# Patient Record
Sex: Female | Born: 1960 | Race: White | Hispanic: No | Marital: Married | Smoking: Current every day smoker
Health system: Southern US, Community
[De-identification: ages and names within clinical notes are randomized; demographics above are authoritative.]

## PROBLEM LIST (undated history)

## (undated) DIAGNOSIS — R112 Nausea with vomiting, unspecified: Secondary | ICD-10-CM

## (undated) DIAGNOSIS — I739 Peripheral vascular disease, unspecified: Secondary | ICD-10-CM

## (undated) DIAGNOSIS — K589 Irritable bowel syndrome without diarrhea: Secondary | ICD-10-CM

## (undated) DIAGNOSIS — K219 Gastro-esophageal reflux disease without esophagitis: Secondary | ICD-10-CM

## (undated) DIAGNOSIS — Z87442 Personal history of urinary calculi: Secondary | ICD-10-CM

## (undated) DIAGNOSIS — I10 Essential (primary) hypertension: Secondary | ICD-10-CM

## (undated) DIAGNOSIS — M1990 Unspecified osteoarthritis, unspecified site: Secondary | ICD-10-CM

## (undated) DIAGNOSIS — R519 Headache, unspecified: Secondary | ICD-10-CM

## (undated) DIAGNOSIS — D649 Anemia, unspecified: Secondary | ICD-10-CM

## (undated) DIAGNOSIS — Z9889 Other specified postprocedural states: Secondary | ICD-10-CM

## (undated) DIAGNOSIS — E119 Type 2 diabetes mellitus without complications: Secondary | ICD-10-CM

## (undated) HISTORY — PX: ANTERIOR FUSION CERVICAL SPINE: SUR626

## (undated) HISTORY — PX: APPENDECTOMY: SHX54

## (undated) HISTORY — PX: POSTERIOR FUSION CERVICAL SPINE: SUR628

---
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
## 1981-02-24 HISTORY — PX: DILATION AND CURETTAGE, DIAGNOSTIC / THERAPEUTIC: SUR384

## 1983-02-25 HISTORY — PX: ABDOMINAL HYSTERECTOMY: SHX81

## 1991-02-25 HISTORY — PX: OTHER SURGICAL HISTORY: SHX169

## 1995-02-25 HISTORY — PX: OTHER SURGICAL HISTORY: SHX169

## 1996-02-25 HISTORY — PX: CARPAL TUNNEL RELEASE: SHX101

## 1997-06-29 ENCOUNTER — Ambulatory Visit (HOSPITAL_BASED_OUTPATIENT_CLINIC_OR_DEPARTMENT_OTHER): Admission: RE | Admit: 1997-06-29 | Discharge: 1997-06-29 | Payer: Self-pay | Admitting: Orthopedic Surgery

## 1999-01-01 ENCOUNTER — Encounter: Payer: Self-pay | Admitting: Obstetrics and Gynecology

## 1999-01-01 ENCOUNTER — Encounter: Payer: Self-pay | Admitting: Emergency Medicine

## 1999-01-23 ENCOUNTER — Encounter: Payer: Self-pay | Admitting: Obstetrics and Gynecology

## 1999-08-14 ENCOUNTER — Encounter: Payer: Self-pay | Admitting: Obstetrics and Gynecology

## 2000-03-23 ENCOUNTER — Encounter: Payer: Self-pay | Admitting: Emergency Medicine

## 2000-07-13 ENCOUNTER — Encounter: Payer: Self-pay | Admitting: Obstetrics and Gynecology

## 2000-07-22 ENCOUNTER — Encounter: Payer: Self-pay | Admitting: Emergency Medicine

## 2001-01-11 ENCOUNTER — Encounter: Payer: Self-pay | Admitting: Internal Medicine

## 2004-01-12 ENCOUNTER — Ambulatory Visit: Payer: Self-pay | Admitting: Family Medicine

## 2004-02-25 HISTORY — PX: EYE SURGERY: SHX253

## 2004-06-04 ENCOUNTER — Ambulatory Visit: Payer: Self-pay | Admitting: Family Medicine

## 2004-08-20 ENCOUNTER — Ambulatory Visit: Payer: Self-pay | Admitting: Family Medicine

## 2004-08-21 ENCOUNTER — Ambulatory Visit: Payer: Self-pay | Admitting: Family Medicine

## 2004-12-23 ENCOUNTER — Ambulatory Visit: Payer: Self-pay | Admitting: Family Medicine

## 2006-02-24 HISTORY — PX: CHOLECYSTECTOMY: SHX55

## 2006-05-29 ENCOUNTER — Encounter: Admission: RE | Admit: 2006-05-29 | Discharge: 2006-05-29 | Payer: Self-pay | Admitting: Orthopedic Surgery

## 2006-05-30 ENCOUNTER — Emergency Department (HOSPITAL_COMMUNITY): Admission: EM | Admit: 2006-05-30 | Discharge: 2006-05-30 | Payer: Self-pay | Admitting: Emergency Medicine

## 2008-02-25 HISTORY — PX: RECTOCELE REPAIR: SHX761

## 2008-02-25 HISTORY — PX: CYSTOCELE REPAIR: SHX163

## 2009-09-13 ENCOUNTER — Ambulatory Visit: Payer: Self-pay | Admitting: Obstetrics and Gynecology

## 2010-02-08 ENCOUNTER — Encounter
Admission: RE | Admit: 2010-02-08 | Discharge: 2010-02-08 | Payer: Self-pay | Source: Home / Self Care | Attending: Orthopedic Surgery | Admitting: Orthopedic Surgery

## 2014-06-14 ENCOUNTER — Other Ambulatory Visit: Payer: Self-pay | Admitting: Orthopedic Surgery

## 2014-06-14 DIAGNOSIS — M542 Cervicalgia: Secondary | ICD-10-CM

## 2014-06-16 ENCOUNTER — Other Ambulatory Visit: Payer: Self-pay

## 2014-06-19 ENCOUNTER — Ambulatory Visit
Admission: RE | Admit: 2014-06-19 | Discharge: 2014-06-19 | Disposition: A | Discharge status: Home / Self Care | Payer: BC Managed Care – PPO | Source: Ambulatory Visit | Attending: Orthopedic Surgery | Admitting: Orthopedic Surgery

## 2014-06-19 DIAGNOSIS — M542 Cervicalgia: Secondary | ICD-10-CM

## 2014-06-19 MED ORDER — DIAZEPAM 5 MG PO TABS
5.0000 mg | ORAL_TABLET | Freq: Once | ORAL | Status: AC
  Administered 2014-06-19: 5 mg via ORAL

## 2014-06-19 MED ORDER — ONDANSETRON HCL 4 MG/2ML IJ SOLN
4.0000 mg | Freq: Once | INTRAMUSCULAR | Status: AC
  Administered 2014-06-19: 4 mg via INTRAMUSCULAR

## 2014-06-19 MED ORDER — ONDANSETRON HCL 4 MG/2ML IJ SOLN: 4.0000 mg | Freq: Four times a day (QID) | INTRAMUSCULAR | Status: DC | PRN

## 2014-06-19 MED ORDER — IOHEXOL 300 MG/ML  SOLN
10.0000 mL | Freq: Once | INTRAMUSCULAR | Status: AC | PRN
  Administered 2014-06-19: 10 mL via INTRATHECAL

## 2014-06-19 MED ORDER — MEPERIDINE HCL 100 MG/ML IJ SOLN
75.0000 mg | Freq: Once | INTRAMUSCULAR | Status: AC
  Administered 2014-06-19: 75 mg via INTRAMUSCULAR

## 2014-06-19 NOTE — Discharge Instructions (Signed)

## 2015-05-08 DIAGNOSIS — E782 Mixed hyperlipidemia: Secondary | ICD-10-CM | POA: Insufficient documentation

## 2015-05-08 DIAGNOSIS — E119 Type 2 diabetes mellitus without complications: Secondary | ICD-10-CM | POA: Insufficient documentation

## 2015-05-08 DIAGNOSIS — I10 Essential (primary) hypertension: Secondary | ICD-10-CM | POA: Insufficient documentation

## 2015-11-07 DIAGNOSIS — G43909 Migraine, unspecified, not intractable, without status migrainosus: Secondary | ICD-10-CM | POA: Insufficient documentation

## 2015-11-07 DIAGNOSIS — K589 Irritable bowel syndrome without diarrhea: Secondary | ICD-10-CM | POA: Insufficient documentation

## 2015-11-07 DIAGNOSIS — R32 Unspecified urinary incontinence: Secondary | ICD-10-CM | POA: Insufficient documentation

## 2015-11-07 DIAGNOSIS — G5603 Carpal tunnel syndrome, bilateral upper limbs: Secondary | ICD-10-CM | POA: Insufficient documentation

## 2016-08-21 ENCOUNTER — Other Ambulatory Visit: Payer: Self-pay | Admitting: Physical Medicine and Rehabilitation

## 2016-09-04 ENCOUNTER — Other Ambulatory Visit: Payer: Self-pay | Admitting: Physical Medicine and Rehabilitation

## 2016-09-05 ENCOUNTER — Other Ambulatory Visit: Payer: Self-pay | Admitting: Physical Medicine and Rehabilitation

## 2016-09-05 DIAGNOSIS — M5416 Radiculopathy, lumbar region: Secondary | ICD-10-CM

## 2016-09-09 ENCOUNTER — Ambulatory Visit
Admission: RE | Admit: 2016-09-09 | Discharge: 2016-09-09 | Disposition: A | Discharge status: Home / Self Care | Payer: BC Managed Care – PPO | Source: Ambulatory Visit | Attending: Physical Medicine and Rehabilitation | Admitting: Physical Medicine and Rehabilitation

## 2016-09-09 DIAGNOSIS — M5416 Radiculopathy, lumbar region: Secondary | ICD-10-CM

## 2016-10-13 DIAGNOSIS — F17200 Nicotine dependence, unspecified, uncomplicated: Secondary | ICD-10-CM | POA: Insufficient documentation

## 2016-10-13 DIAGNOSIS — Z716 Tobacco abuse counseling: Secondary | ICD-10-CM | POA: Insufficient documentation

## 2016-11-21 DIAGNOSIS — I739 Peripheral vascular disease, unspecified: Secondary | ICD-10-CM | POA: Insufficient documentation

## 2017-11-25 DIAGNOSIS — Z8249 Family history of ischemic heart disease and other diseases of the circulatory system: Secondary | ICD-10-CM | POA: Insufficient documentation

## 2018-12-02 DIAGNOSIS — R399 Unspecified symptoms and signs involving the genitourinary system: Secondary | ICD-10-CM | POA: Insufficient documentation

## 2019-01-17 ENCOUNTER — Other Ambulatory Visit: Payer: Self-pay | Admitting: Physical Medicine and Rehabilitation

## 2019-02-01 ENCOUNTER — Other Ambulatory Visit: Payer: Self-pay | Admitting: Physical Medicine and Rehabilitation

## 2019-02-01 DIAGNOSIS — M5416 Radiculopathy, lumbar region: Secondary | ICD-10-CM

## 2019-02-09 ENCOUNTER — Ambulatory Visit
Admission: RE | Admit: 2019-02-09 | Discharge: 2019-02-09 | Disposition: A | Discharge status: Home / Self Care | Payer: BC Managed Care – PPO | Source: Ambulatory Visit | Attending: Physical Medicine and Rehabilitation | Admitting: Physical Medicine and Rehabilitation

## 2019-02-09 DIAGNOSIS — M5416 Radiculopathy, lumbar region: Secondary | ICD-10-CM

## 2019-05-03 ENCOUNTER — Other Ambulatory Visit: Payer: Self-pay | Admitting: Neurosurgery

## 2019-05-03 DIAGNOSIS — M8580 Other specified disorders of bone density and structure, unspecified site: Secondary | ICD-10-CM

## 2019-05-06 ENCOUNTER — Other Ambulatory Visit: Payer: Self-pay | Admitting: Neurosurgery

## 2019-05-09 ENCOUNTER — Other Ambulatory Visit: Payer: Self-pay | Admitting: Neurosurgery

## 2019-05-09 ENCOUNTER — Telehealth: Payer: Self-pay | Admitting: Nurse Practitioner

## 2019-05-09 DIAGNOSIS — M4316 Spondylolisthesis, lumbar region: Secondary | ICD-10-CM

## 2019-05-09 NOTE — Telephone Encounter (Signed)
Phone call to patient to verify medication list and allergies for myelogram procedure. Pt instructed to hold Nucynta for 48hrs prior to myelogram appointment time. Pt verbalized understanding. Pre and post procedure instructions reviewed with pt.

## 2019-05-16 ENCOUNTER — Ambulatory Visit
Admission: RE | Admit: 2019-05-16 | Discharge: 2019-05-16 | Disposition: A | Discharge status: Home / Self Care | Payer: BC Managed Care – PPO | Source: Ambulatory Visit | Attending: Neurosurgery | Admitting: Neurosurgery

## 2019-05-16 DIAGNOSIS — M4316 Spondylolisthesis, lumbar region: Secondary | ICD-10-CM

## 2019-05-16 MED ORDER — DIAZEPAM 5 MG PO TABS
10.0000 mg | ORAL_TABLET | Freq: Once | ORAL | Status: AC
  Administered 2019-05-16: 10:00:00 5 mg via ORAL

## 2019-05-16 MED ORDER — IOPAMIDOL (ISOVUE-M 200) INJECTION 41%
15.0000 mL | Freq: Once | INTRAMUSCULAR | Status: AC
  Administered 2019-05-16: 11:00:00 15 mL via INTRATHECAL

## 2019-05-16 NOTE — Progress Notes (Signed)
Patient states she has been off Nucynta for at least the past two days.

## 2019-05-16 NOTE — Discharge Instructions (Signed)

## 2019-05-30 ENCOUNTER — Ambulatory Visit
Admission: RE | Admit: 2019-05-30 | Discharge: 2019-05-30 | Disposition: A | Discharge status: Home / Self Care | Payer: BC Managed Care – PPO | Source: Ambulatory Visit | Attending: Neurosurgery | Admitting: Neurosurgery

## 2019-05-30 ENCOUNTER — Other Ambulatory Visit: Payer: Self-pay

## 2019-05-30 DIAGNOSIS — M8580 Other specified disorders of bone density and structure, unspecified site: Secondary | ICD-10-CM

## 2019-10-10 ENCOUNTER — Other Ambulatory Visit: Payer: Self-pay | Admitting: Neurosurgery

## 2019-10-20 ENCOUNTER — Encounter (HOSPITAL_COMMUNITY)
Admission: RE | Admit: 2019-10-20 | Discharge: 2019-10-20 | Disposition: A | Discharge status: Home / Self Care | Payer: BC Managed Care – PPO | Source: Ambulatory Visit | Attending: Neurosurgery | Admitting: Neurosurgery

## 2019-10-20 ENCOUNTER — Encounter (HOSPITAL_COMMUNITY): Payer: Self-pay

## 2019-10-20 ENCOUNTER — Other Ambulatory Visit: Payer: Self-pay

## 2019-10-20 DIAGNOSIS — E119 Type 2 diabetes mellitus without complications: Secondary | ICD-10-CM | POA: Insufficient documentation

## 2019-10-20 DIAGNOSIS — I10 Essential (primary) hypertension: Secondary | ICD-10-CM | POA: Insufficient documentation

## 2019-10-20 DIAGNOSIS — Z01818 Encounter for other preprocedural examination: Secondary | ICD-10-CM | POA: Diagnosis present

## 2019-10-20 HISTORY — DX: Unspecified osteoarthritis, unspecified site: M19.90

## 2019-10-20 HISTORY — DX: Headache, unspecified: R51.9

## 2019-10-20 HISTORY — DX: Gastro-esophageal reflux disease without esophagitis: K21.9

## 2019-10-20 HISTORY — DX: Anemia, unspecified: D64.9

## 2019-10-20 HISTORY — DX: Personal history of urinary calculi: Z87.442

## 2019-10-20 HISTORY — DX: Peripheral vascular disease, unspecified: I73.9

## 2019-10-20 HISTORY — DX: Essential (primary) hypertension: I10

## 2019-10-20 HISTORY — DX: Type 2 diabetes mellitus without complications: E11.9

## 2019-10-20 HISTORY — DX: Nausea with vomiting, unspecified: R11.2

## 2019-10-20 LAB — BASIC METABOLIC PANEL
Anion gap: 10 (ref 5–15)
BUN: 6 mg/dL (ref 6–20)
CO2: 24 mmol/L (ref 22–32)
Calcium: 9.3 mg/dL (ref 8.9–10.3)
Chloride: 106 mmol/L (ref 98–111)
Creatinine, Ser: 0.79 mg/dL (ref 0.44–1.00)
GFR calc Af Amer: >60 mL/min (ref >60)
GFR calc non Af Amer: >60 mL/min (ref >60)
Glucose, Bld: 102 mg/dL — ABNORMAL HIGH (ref 70–99)
Potassium: 3.7 mmol/L (ref 3.5–5.1)
Sodium: 140 mmol/L (ref 135–145)

## 2019-10-20 LAB — CBC
HCT: 44.5 % (ref 36.0–46.0)
Hemoglobin: 13.9 g/dL (ref 12.0–15.0)
MCH: 27.9 pg (ref 26.0–34.0)
MCHC: 31.2 g/dL (ref 30.0–36.0)
MCV: 89.2 fL (ref 80.0–100.0)
Platelets: 217 10*3/uL (ref 150–400)
RBC: 4.99 MIL/uL (ref 3.87–5.11)
RDW: 14.3 % (ref 11.5–15.5)
WBC: 7.5 10*3/uL (ref 4.0–10.5)
nRBC: 0 % (ref 0.0–0.2)

## 2019-10-20 LAB — TYPE AND SCREEN
ABO/RH(D): O NEG
Antibody Screen: NEGATIVE

## 2019-10-20 LAB — HEMOGLOBIN A1C
Hgb A1c MFr Bld: 6.6 % — ABNORMAL HIGH (ref 4.8–5.6)
Mean Plasma Glucose: 142.72 mg/dL

## 2019-10-20 LAB — SURGICAL PCR SCREEN
MRSA, PCR: NEGATIVE
Staphylococcus aureus: NEGATIVE

## 2019-10-20 LAB — GLUCOSE, CAPILLARY: Glucose-Capillary: 105 mg/dL — ABNORMAL HIGH (ref 70–99)

## 2019-10-20 NOTE — Progress Notes (Addendum)
PCP - Molly Maduro L. Dough, MD Cardiologist - Denies  PPM/ICD - Denies  Chest x-ray - N/A EKG - 10/20/19 Stress Test - Denies ECHO - Denies Cardiac Cath - Denies  Sleep Study - Denies  Fasting Blood Sugar: 110- 130s Checks Blood Sugar 1-2 times a day. CBG at PAT appointment was  105. A1C obtained.   Blood Thinner Instructions: N/A Aspirin Instructions: Per patient, last dose 10/18/19  ERAS Protcol - N/A PRE-SURGERY Ensure or G2- N/A  COVID TEST- 10/24/19   Anesthesia review: No  Patient denies shortness of breath, fever, cough and chest pain at PAT appointment   All instructions explained to the patient, with a verbal understanding of the material. Patient agrees to go over the instructions while at home for a better understanding. Patient also instructed to self quarantine after being tested for COVID-19. The opportunity to ask questions was provided.

## 2019-10-20 NOTE — Pre-Procedure Instructions (Addendum)
Your procedure is scheduled on Wednesday, September 1, from 08:30 AM- 2:50 PM.  Report to [facility] Main Entrance "A" at 06:30 A.M., and check in at the Admitting office.  Call this number if you have problems the morning of surgery:  [phone]  Call [phone] if you have any questions prior to your surgery date Monday-Friday 8am-4pm.    Remember:  Do not eat or drink after midnight the night before your surgery.     Take these medicines the morning of surgery with A SIP OF WATER: amLODipine (NORVASC)  carvedilol (COREG) cycloSPORINE (RESTASIS) eye drops fluticasone (FLONASE) nasal spray  IF NEEDED: HYDROcodone-acetaminophen (NORCO/VICODIN) NUCYNTA ER tiZANidine (ZANAFLEX)    Follow your surgeon's instructions on when to stop Aspirin.  If no instructions were given by your surgeon then you will need to call the office to get those instructions.    As of today, STOP taking any Aleve, Naproxen, Ibuprofen, Motrin, Advil, Goody's, BC's, all herbal medications, fish oil, and all vitamins.    WHAT DO I DO ABOUT MY DIABETES MEDICATION?  Marland Kitchen Do not take metFORMIN (GLUCOPHAGE) or sitaGLIPtin (JANUVIA) the morning of surgery.   HOW TO MANAGE YOUR DIABETES BEFORE AND AFTER SURGERY  Why is it important to control my blood sugar before and after surgery? . Improving blood sugar levels before and after surgery helps healing and can limit problems. . A way of improving blood sugar control is eating a healthy diet by: o  Eating less sugar and carbohydrates o  Increasing activity/exercise o  Talking with your doctor about reaching your blood sugar goals . High blood sugars (greater than 180 mg/dL) can raise your risk of infections and slow your recovery, so you will need to focus on controlling your diabetes during the weeks before surgery. . Make sure that the doctor who takes care of your diabetes knows about your planned surgery including the date and location.  How do I  manage my blood sugar before surgery? . Check your blood sugar at least 4 times a day, starting 2 days before surgery, to make sure that the level is not too high or low. . Check your blood sugar the morning of your surgery when you wake up and every 2 hours until you get to the Short Stay unit. o If your blood sugar is less than 70 mg/dL, you will need to treat for low blood sugar: - Treat a low blood sugar (less than 70 mg/dL) with  cup of clear juice (cranberry or apple), 4 glucose tablets, OR glucose gel. - Recheck blood sugar in 15 minutes after treatment (to make sure it is greater than 70 mg/dL). If your blood sugar is not greater than 70 mg/dL on recheck, call [phone] for further instructions. . Report your blood sugar to the short stay nurse when you get to Short Stay.  . If you are admitted to the hospital after surgery: o Your blood sugar will be checked by the staff and you will probably be given insulin after surgery (instead of oral diabetes medicines) to make sure you have good blood sugar levels. o The goal for blood sugar control after surgery is 80-180 mg/dL.        The Morning of Surgery:               Do not wear jewelry, make up, or nail polish.            Do not wear lotions, powders, perfumes, or deodorant.  Do not shave 48 hours prior to surgery.              Do not bring valuables to the hospital.            [facility] is not responsible for any belongings or valuables.  Do NOT Smoke (Tobacco/Vaping) or drink Alcohol 24 hours prior to your procedure.  If you use a CPAP at night, you may bring all equipment for your overnight stay.   Contacts, glasses, dentures or bridgework may not be worn into surgery.      For patients admitted to the hospital, discharge time will be determined by your treatment team.   Patients discharged the day of surgery will not be allowed to drive home, and someone needs to stay with them for 24 hours.    Special  instructions:   [facility]- Preparing For Surgery  Before surgery, you can play an important role. Because skin is not sterile, your skin needs to be as free of germs as possible. You can reduce the number of germs on your skin by washing with CHG (chlorahexidine gluconate) Soap before surgery.  CHG is an antiseptic cleaner which kills germs and bonds with the skin to continue killing germs even after washing.    Oral Hygiene is also important to reduce your risk of infection.  Remember - BRUSH YOUR TEETH THE MORNING OF SURGERY WITH YOUR REGULAR TOOTHPASTE  Please do not use if you have an allergy to CHG or antibacterial soaps. If your skin becomes reddened/irritated stop using the CHG.  Do not shave (including legs and underarms) for at least 48 hours prior to first CHG shower. It is OK to shave your face.  Please follow these instructions carefully.   1. Shower the NIGHT BEFORE SURGERY and the MORNING OF SURGERY with CHG Soap.   2. If you chose to wash your hair, wash your hair first as usual with your normal shampoo.  3. After you shampoo, rinse your hair and body thoroughly to remove the shampoo.  4. Use CHG as you would any other liquid soap. You can apply CHG directly to the skin and wash gently with a scrungie or a clean washcloth.   5. Apply the CHG Soap to your body ONLY FROM THE NECK DOWN.  Do not use on open wounds or open sores. Avoid contact with your eyes, ears, mouth and genitals (private parts). Wash Face and genitals (private parts)  with your normal soap.   6. Wash thoroughly, paying special attention to the area where your surgery will be performed.  7. Thoroughly rinse your body with warm water from the neck down.  8. DO NOT shower/wash with your normal soap after using and rinsing off the CHG Soap.  9. Pat yourself dry with a CLEAN TOWEL.  10. Wear CLEAN PAJAMAS to bed the night before surgery  11. Place CLEAN SHEETS on your bed the night of your first shower and  DO NOT SLEEP WITH PETS.   Day of Surgery: Wear Clean/Comfortable clothing the morning of surgery. Do not apply any deodorants/lotions.   Remember to brush your teeth WITH YOUR REGULAR TOOTHPASTE.   Please read over the following fact sheets that you were given.

## 2019-10-24 ENCOUNTER — Other Ambulatory Visit (HOSPITAL_COMMUNITY): Payer: BC Managed Care – PPO

## 2019-12-02 NOTE — Pre-Procedure Instructions (Addendum)
Melissa Morrison  12/02/2019    Your procedure is scheduled on Wednesday, December 07, 2019 at 8:30 AM.   Report to [facility] Entrance "A" Admitting Office at 6:30 AM.   Call this number if you have problems the morning of surgery: [phone]   Questions prior to day of surgery, please call [phone] between 8 & 4 PM.   Remember:  Do not eat or drink after midnight Tuesday, 12/06/19.  Take these medicines the morning of surgery with A SIP OF WATER: Amlodipine (Norvasc), Carvediolol (Coreg), Nucynta ER, Tizanidine (Zanaflex), Flonase nasal spray, Restasis eye drops  Stop Aspirin as instructed by surgeon/physician. Do not use other Aspirin containing products, NSAIDS (Ibuprofen, Aleve, etc.), Herbal medications, Multivitamins or Fish oil prior to surgery.  Do not smoke 24 hours prior to surgery.  Do not take your Metformin or Januvia day of surgery.  HOW TO MANAGE YOUR DIABETES BEFORE AND AFTER SURGERY  Why is it important to control my blood sugar before and after surgery? . Improving blood sugar levels before and after surgery helps healing and can limit problems. . A way of improving blood sugar control is eating a healthy diet by: o  Eating less sugar and carbohydrates o  Increasing activity/exercise o  Talking with your doctor about reaching your blood sugar goals . High blood sugars (greater than 180 mg/dL) can raise your risk of infections and slow your recovery, so you will need to focus on controlling your diabetes during the weeks before surgery. . Make sure that the doctor who takes care of your diabetes knows about your planned surgery including the date and location.  How do I manage my blood sugar before surgery? . Check your blood sugar at least 4 times a day, starting 2 days before surgery, to make sure that the level is not too high or low. . Check your blood sugar the morning of your surgery when you wake up and every 2 hours until you get to the Short  Stay unit. o If your blood sugar is less than 70 mg/dL, you will need to treat for low blood sugar: - Do not take insulin. - Treat a low blood sugar (less than 70 mg/dL) with  cup of clear juice (cranberry or apple), 4 glucose tablets, OR glucose gel. - Recheck blood sugar in 15 minutes after treatment (to make sure it is greater than 70 mg/dL). If your blood sugar is not greater than 70 mg/dL on recheck, call [phone] for further instructions. . Report your blood sugar to the short stay nurse when you get to Short Stay.  . If you are admitted to the hospital after surgery: o Your blood sugar will be checked by the staff and you will probably be given insulin after surgery (instead of oral diabetes medicines) to make sure you have good blood sugar levels. o The goal for blood sugar control after surgery is 80-180 mg/dL.   Do NOT smoke 24 hours prior to surgery.   Do not wear jewelry, make-up or nail polish.  Do not wear lotions, powders, perfumes or deodorant.  Do not shave 48 hours prior to surgery.    Do not bring valuables to the hospital.  [facility] is not responsible for any belongings or valuables.  Contacts, dentures or bridgework may not be worn into surgery.  Leave your suitcase in the car.  After surgery it may be brought to your room.  For patients admitted to the hospital, discharge time will  be determined by your treatment team.  [facility] - Preparing for Surgery  Before surgery, you can play an important role.  Because skin is not sterile, your skin needs to be as free of germs as possible.  You can reduce the number of germs on you skin by washing with CHG (chlorahexidine gluconate) soap before surgery.  CHG is an antiseptic cleaner which kills germs and bonds with the skin to continue killing germs even after washing.  Oral Hygiene is also important in reducing the risk of infection.  Remember to brush your teeth with your regular toothpaste the morning of  surgery.  Please DO NOT use if you have an allergy to CHG or antibacterial soaps.  If your skin becomes reddened/irritated stop using the CHG and inform your nurse when you arrive at Short Stay.  Do not shave (including legs and underarms) for at least 48 hours prior to the first CHG shower.  You may shave your face.  Please follow these instructions carefully:   1.  Shower with CHG Soap the night before surgery and the morning of Surgery.  2.  If you choose to wash your hair, wash your hair first as usual with your normal shampoo.  3.  After you shampoo, rinse your hair and body thoroughly to remove the shampoo. 4.  Use CHG as you would any other liquid soap.  You can apply chg directly to the skin and wash gently with a      scrungie or washcloth.           5.  Apply the CHG Soap to your body ONLY FROM THE NECK DOWN.   Do not use on open wounds or open sores. Avoid contact with your eyes, ears, mouth and genitals (private parts).  Wash genitals (private parts) with your normal soap - do this prior to using the CHG soap.   6.  Wash thoroughly, paying special attention to the area where your surgery will be performed.  7.  Thoroughly rinse your body with warm water from the neck down.  8.  DO NOT shower/wash with your normal soap after using and rinsing off the CHG Soap.  9.  Pat yourself dry with a clean towel.            10.  Wear clean pajamas.            11.  Place clean sheets on your bed the night of your first shower and do not sleep with pets.  Day of Surgery  Shower as above. Do not apply any lotions/deodorants the morning of surgery.   Please wear clean clothes to the hospital. Remember to brush your teeth with toothpaste.  Please read over the fact sheets that you were given.

## 2019-12-05 ENCOUNTER — Other Ambulatory Visit (HOSPITAL_COMMUNITY)
Admission: RE | Admit: 2019-12-05 | Discharge: 2019-12-05 | Disposition: A | Discharge status: Home / Self Care | Payer: BC Managed Care – PPO | Source: Ambulatory Visit | Attending: Neurosurgery | Admitting: Neurosurgery

## 2019-12-05 ENCOUNTER — Encounter (HOSPITAL_COMMUNITY): Payer: Self-pay

## 2019-12-05 ENCOUNTER — Encounter (HOSPITAL_COMMUNITY)
Admission: RE | Admit: 2019-12-05 | Discharge: 2019-12-05 | Disposition: A | Discharge status: Home / Self Care | Payer: BC Managed Care – PPO | Source: Ambulatory Visit | Attending: Neurosurgery | Admitting: Neurosurgery

## 2019-12-05 ENCOUNTER — Other Ambulatory Visit: Payer: Self-pay

## 2019-12-05 DIAGNOSIS — Z20822 Contact with and (suspected) exposure to covid-19: Secondary | ICD-10-CM | POA: Insufficient documentation

## 2019-12-05 DIAGNOSIS — Z01812 Encounter for preprocedural laboratory examination: Secondary | ICD-10-CM | POA: Insufficient documentation

## 2019-12-05 DIAGNOSIS — Z01818 Encounter for other preprocedural examination: Secondary | ICD-10-CM | POA: Insufficient documentation

## 2019-12-05 HISTORY — DX: Irritable bowel syndrome without diarrhea: K58.9

## 2019-12-05 LAB — TYPE AND SCREEN
ABO/RH(D): O NEG
Antibody Screen: NEGATIVE

## 2019-12-05 LAB — SURGICAL PCR SCREEN
MRSA, PCR: NEGATIVE
Staphylococcus aureus: NEGATIVE

## 2019-12-05 LAB — GLUCOSE, CAPILLARY: Glucose-Capillary: 119 mg/dL — ABNORMAL HIGH (ref 70–99)

## 2019-12-05 LAB — CBC
HCT: 44 % (ref 36.0–46.0)
Hemoglobin: 14 g/dL (ref 12.0–15.0)
MCH: 28.5 pg (ref 26.0–34.0)
MCHC: 31.8 g/dL (ref 30.0–36.0)
MCV: 89.6 fL (ref 80.0–100.0)
Platelets: 217 10*3/uL (ref 150–400)
RBC: 4.91 MIL/uL (ref 3.87–5.11)
RDW: 14.5 % (ref 11.5–15.5)
WBC: 6.5 10*3/uL (ref 4.0–10.5)
nRBC: 0 % (ref 0.0–0.2)

## 2019-12-05 LAB — BASIC METABOLIC PANEL
Anion gap: 11 (ref 5–15)
BUN: 7 mg/dL (ref 6–20)
CO2: 21 mmol/L — ABNORMAL LOW (ref 22–32)
Calcium: 9.4 mg/dL (ref 8.9–10.3)
Chloride: 107 mmol/L (ref 98–111)
Creatinine, Ser: 0.77 mg/dL (ref 0.44–1.00)
GFR, Estimated: >60 mL/min (ref >60)
Glucose, Bld: 103 mg/dL — ABNORMAL HIGH (ref 70–99)
Potassium: 3.9 mmol/L (ref 3.5–5.1)
Sodium: 139 mmol/L (ref 135–145)

## 2019-12-05 LAB — SARS CORONAVIRUS 2 (TAT 6-24 HRS): SARS Coronavirus 2: NEGATIVE

## 2019-12-05 NOTE — Progress Notes (Signed)
PCP - Dr. Dina Rich  EKG - 10/20/19   A1C was 6.1 on 11/28/19 Checks Blood Sugar _1___ times a day   COVID TEST- scheduled for today   Anesthesia review: No  Patient denies shortness of breath, fever, cough and chest pain at PAT appointment   All instructions explained to the patient, with a verbal understanding of the material. Patient agrees to go over the instructions while at home for a better understanding. Patient also instructed to self quarantine after being tested for COVID-19. The opportunity to ask questions was provided.

## 2019-12-07 ENCOUNTER — Encounter (HOSPITAL_COMMUNITY): Payer: Self-pay

## 2019-12-07 ENCOUNTER — Encounter (HOSPITAL_COMMUNITY)
Admission: RE | Disposition: A | Discharge status: Home / Self Care | Payer: Self-pay | Source: Ambulatory Visit | Attending: Neurosurgery

## 2019-12-07 ENCOUNTER — Inpatient Hospital Stay (HOSPITAL_COMMUNITY): Payer: BC Managed Care – PPO

## 2019-12-07 ENCOUNTER — Inpatient Hospital Stay (HOSPITAL_COMMUNITY): Payer: BC Managed Care – PPO | Admitting: Certified Registered"

## 2019-12-07 ENCOUNTER — Other Ambulatory Visit: Payer: Self-pay

## 2019-12-07 ENCOUNTER — Inpatient Hospital Stay (HOSPITAL_COMMUNITY)
Admission: RE | Admit: 2019-12-07 | Discharge: 2019-12-08 | DRG: 460 | Disposition: A | Discharge status: Home / Self Care | Payer: BC Managed Care – PPO | Source: Ambulatory Visit | Attending: Neurosurgery | Admitting: Neurosurgery

## 2019-12-07 DIAGNOSIS — Z888 Allergy status to other drugs, medicaments and biological substances status: Secondary | ICD-10-CM

## 2019-12-07 DIAGNOSIS — Z7982 Long term (current) use of aspirin: Secondary | ICD-10-CM | POA: Diagnosis not present

## 2019-12-07 DIAGNOSIS — I10 Essential (primary) hypertension: Secondary | ICD-10-CM | POA: Diagnosis present

## 2019-12-07 DIAGNOSIS — Z9049 Acquired absence of other specified parts of digestive tract: Secondary | ICD-10-CM | POA: Diagnosis not present

## 2019-12-07 DIAGNOSIS — Z20822 Contact with and (suspected) exposure to covid-19: Secondary | ICD-10-CM | POA: Diagnosis present

## 2019-12-07 DIAGNOSIS — G8929 Other chronic pain: Secondary | ICD-10-CM | POA: Diagnosis present

## 2019-12-07 DIAGNOSIS — M4316 Spondylolisthesis, lumbar region: Secondary | ICD-10-CM | POA: Diagnosis present

## 2019-12-07 DIAGNOSIS — K219 Gastro-esophageal reflux disease without esophagitis: Secondary | ICD-10-CM | POA: Diagnosis present

## 2019-12-07 DIAGNOSIS — E782 Mixed hyperlipidemia: Secondary | ICD-10-CM | POA: Diagnosis present

## 2019-12-07 DIAGNOSIS — Z90710 Acquired absence of both cervix and uterus: Secondary | ICD-10-CM | POA: Diagnosis not present

## 2019-12-07 DIAGNOSIS — Z419 Encounter for procedure for purposes other than remedying health state, unspecified: Secondary | ICD-10-CM

## 2019-12-07 DIAGNOSIS — Z87442 Personal history of urinary calculi: Secondary | ICD-10-CM | POA: Diagnosis not present

## 2019-12-07 DIAGNOSIS — E1151 Type 2 diabetes mellitus with diabetic peripheral angiopathy without gangrene: Secondary | ICD-10-CM | POA: Diagnosis present

## 2019-12-07 DIAGNOSIS — M5116 Intervertebral disc disorders with radiculopathy, lumbar region: Principal | ICD-10-CM | POA: Diagnosis present

## 2019-12-07 DIAGNOSIS — Z79899 Other long term (current) drug therapy: Secondary | ICD-10-CM | POA: Diagnosis not present

## 2019-12-07 DIAGNOSIS — F17210 Nicotine dependence, cigarettes, uncomplicated: Secondary | ICD-10-CM | POA: Diagnosis present

## 2019-12-07 DIAGNOSIS — M5416 Radiculopathy, lumbar region: Secondary | ICD-10-CM | POA: Diagnosis present

## 2019-12-07 DIAGNOSIS — Z885 Allergy status to narcotic agent status: Secondary | ICD-10-CM | POA: Diagnosis not present

## 2019-12-07 DIAGNOSIS — Z8249 Family history of ischemic heart disease and other diseases of the circulatory system: Secondary | ICD-10-CM

## 2019-12-07 DIAGNOSIS — Z7984 Long term (current) use of oral hypoglycemic drugs: Secondary | ICD-10-CM

## 2019-12-07 HISTORY — PX: ANTERIOR LAT LUMBAR FUSION: SHX1168

## 2019-12-07 HISTORY — PX: LUMBAR PERCUTANEOUS PEDICLE SCREW 1 LEVEL: SHX5560

## 2019-12-07 LAB — GLUCOSE, CAPILLARY
Glucose-Capillary: 129 mg/dL — ABNORMAL HIGH (ref 70–99)
Glucose-Capillary: 167 mg/dL — ABNORMAL HIGH (ref 70–99)
Glucose-Capillary: 204 mg/dL — ABNORMAL HIGH (ref 70–99)
Glucose-Capillary: 127 mg/dL — ABNORMAL HIGH (ref 70–99)

## 2019-12-07 SURGERY — ANTERIOR LATERAL LUMBAR FUSION 1 LEVEL
Anesthesia: General | Site: Spine Lumbar

## 2019-12-07 MED ORDER — FLEET ENEMA 7-19 GM/118ML RE ENEM: 1.0000 | ENEMA | Freq: Once | RECTAL | Status: DC | PRN

## 2019-12-07 MED ORDER — ONDANSETRON HCL 4 MG/2ML IJ SOLN: 4.0000 mg | Freq: Four times a day (QID) | INTRAMUSCULAR | Status: DC | PRN

## 2019-12-07 MED ORDER — SODIUM CHLORIDE 0.9% FLUSH: 3.0000 mL | INTRAVENOUS | Status: DC | PRN

## 2019-12-07 MED ORDER — CARVEDILOL 12.5 MG PO TABS
12.5000 mg | ORAL_TABLET | Freq: Two times a day (BID) | ORAL | Status: DC
  Administered 2019-12-07 – 2019-12-08 (×2): 12.5 mg via ORAL
  Filled 2019-12-07 (×2): qty 1

## 2019-12-07 MED ORDER — ENOXAPARIN SODIUM 40 MG/0.4ML ~~LOC~~ SOLN
40.0000 mg | SUBCUTANEOUS | Status: DC
  Administered 2019-12-08: 40 mg via SUBCUTANEOUS
  Filled 2019-12-07: qty 0.4

## 2019-12-07 MED ORDER — LINAGLIPTIN 5 MG PO TABS
5.0000 mg | ORAL_TABLET | Freq: Every day | ORAL | Status: DC
  Administered 2019-12-07 – 2019-12-08 (×2): 5 mg via ORAL
  Filled 2019-12-07 (×2): qty 1

## 2019-12-07 MED ORDER — PROPOFOL 10 MG/ML IV BOLUS
INTRAVENOUS | Status: DC | PRN
  Administered 2019-12-07: 120 mg via INTRAVENOUS

## 2019-12-07 MED ORDER — SUFENTANIL CITRATE 250 MCG/5ML IV SOLN
0.2500 ug/kg/h | INTRAVENOUS | Status: AC
  Administered 2019-12-07: .25 ug/kg/h via INTRAVENOUS
  Filled 2019-12-07: qty 5

## 2019-12-07 MED ORDER — TIZANIDINE HCL 2 MG PO TABS
2.0000 mg | ORAL_TABLET | Freq: Three times a day (TID) | ORAL | Status: DC | PRN
  Administered 2019-12-07 – 2019-12-08 (×3): 2 mg via ORAL
  Filled 2019-12-07 (×5): qty 1

## 2019-12-07 MED ORDER — LIDOCAINE-EPINEPHRINE 1 %-1:100000 IJ SOLN
INTRAMUSCULAR | Status: DC | PRN
  Administered 2019-12-07: 22 mL

## 2019-12-07 MED ORDER — MEPERIDINE HCL 25 MG/ML IJ SOLN: 6.2500 mg | INTRAMUSCULAR | Status: DC | PRN

## 2019-12-07 MED ORDER — AMLODIPINE BESYLATE 5 MG PO TABS
5.0000 mg | ORAL_TABLET | Freq: Every day | ORAL | Status: DC
  Administered 2019-12-08: 5 mg via ORAL
  Filled 2019-12-07: qty 1

## 2019-12-07 MED ORDER — MIDAZOLAM HCL 2 MG/2ML IJ SOLN
INTRAMUSCULAR | Status: AC
  Filled 2019-12-07: qty 2

## 2019-12-07 MED ORDER — MENTHOL 3 MG MT LOZG: 1.0000 | LOZENGE | OROMUCOSAL | Status: DC | PRN

## 2019-12-07 MED ORDER — ACETAMINOPHEN 650 MG RE SUPP: 650.0000 mg | RECTAL | Status: DC | PRN

## 2019-12-07 MED ORDER — DOCUSATE SODIUM 100 MG PO CAPS
100.0000 mg | ORAL_CAPSULE | Freq: Two times a day (BID) | ORAL | Status: DC
  Administered 2019-12-07 – 2019-12-08 (×3): 100 mg via ORAL
  Filled 2019-12-07 (×3): qty 1

## 2019-12-07 MED ORDER — HYDROMORPHONE HCL 1 MG/ML IJ SOLN
0.2500 mg | INTRAMUSCULAR | Status: DC | PRN
  Administered 2019-12-07 (×4): 0.5 mg via INTRAVENOUS

## 2019-12-07 MED ORDER — HYDROMORPHONE HCL 1 MG/ML IJ SOLN
INTRAMUSCULAR | Status: AC
  Filled 2019-12-07: qty 1

## 2019-12-07 MED ORDER — PROPOFOL 500 MG/50ML IV EMUL
INTRAVENOUS | Status: DC | PRN
  Administered 2019-12-07: 50 ug/kg/min via INTRAVENOUS

## 2019-12-07 MED ORDER — ATORVASTATIN CALCIUM 40 MG PO TABS
40.0000 mg | ORAL_TABLET | Freq: Every day | ORAL | Status: DC
  Administered 2019-12-07: 40 mg via ORAL
  Filled 2019-12-07: qty 1

## 2019-12-07 MED ORDER — ACETAMINOPHEN 10 MG/ML IV SOLN
INTRAVENOUS | Status: DC | PRN
  Administered 2019-12-07: 1000 mg via INTRAVENOUS

## 2019-12-07 MED ORDER — CEFAZOLIN SODIUM-DEXTROSE 2-4 GM/100ML-% IV SOLN
2.0000 g | INTRAVENOUS | Status: AC
  Administered 2019-12-07 (×2): 2 g via INTRAVENOUS
  Filled 2019-12-07: qty 100

## 2019-12-07 MED ORDER — HYDROCODONE-ACETAMINOPHEN 5-325 MG PO TABS
ORAL_TABLET | ORAL | Status: AC
  Filled 2019-12-07: qty 2

## 2019-12-07 MED ORDER — LACTATED RINGERS IV SOLN: INTRAVENOUS | Status: DC | PRN

## 2019-12-07 MED ORDER — PHENYLEPHRINE HCL (PRESSORS) 10 MG/ML IV SOLN
INTRAVENOUS | Status: AC
  Filled 2019-12-07: qty 1

## 2019-12-07 MED ORDER — PROPOFOL 1000 MG/100ML IV EMUL
INTRAVENOUS | Status: AC
  Filled 2019-12-07: qty 100

## 2019-12-07 MED ORDER — METFORMIN HCL 500 MG PO TABS
1000.0000 mg | ORAL_TABLET | Freq: Two times a day (BID) | ORAL | Status: DC
  Administered 2019-12-07 – 2019-12-08 (×2): 1000 mg via ORAL
  Filled 2019-12-07 (×2): qty 2

## 2019-12-07 MED ORDER — PHENYLEPHRINE HCL-NACL 10-0.9 MG/250ML-% IV SOLN
INTRAVENOUS | Status: DC | PRN
  Administered 2019-12-07: 30 ug/min via INTRAVENOUS

## 2019-12-07 MED ORDER — POTASSIUM CHLORIDE IN NACL 20-0.9 MEQ/L-% IV SOLN: INTRAVENOUS | Status: DC

## 2019-12-07 MED ORDER — HYDROMORPHONE HCL 1 MG/ML IJ SOLN
0.5000 mg | INTRAMUSCULAR | Status: DC | PRN
  Administered 2019-12-07 (×2): 0.5 mg via INTRAVENOUS
  Filled 2019-12-07 (×2): qty 0.5

## 2019-12-07 MED ORDER — LACTATED RINGERS IV SOLN: INTRAVENOUS | Status: DC

## 2019-12-07 MED ORDER — SODIUM CHLORIDE 0.9% FLUSH
3.0000 mL | Freq: Two times a day (BID) | INTRAVENOUS | Status: DC
  Administered 2019-12-07 – 2019-12-08 (×2): 3 mL via INTRAVENOUS

## 2019-12-07 MED ORDER — PHENYLEPHRINE HCL-NACL 10-0.9 MG/250ML-% IV SOLN
INTRAVENOUS | Status: AC
  Filled 2019-12-07: qty 250

## 2019-12-07 MED ORDER — LIDOCAINE-EPINEPHRINE 1 %-1:100000 IJ SOLN
INTRAMUSCULAR | Status: AC
  Filled 2019-12-07: qty 1

## 2019-12-07 MED ORDER — HYDROMORPHONE HCL 1 MG/ML IJ SOLN
INTRAMUSCULAR | Status: DC | PRN
  Administered 2019-12-07: .5 mg via INTRAVENOUS

## 2019-12-07 MED ORDER — ONDANSETRON HCL 4 MG/2ML IJ SOLN
INTRAMUSCULAR | Status: DC | PRN
  Administered 2019-12-07: 4 mg via INTRAVENOUS

## 2019-12-07 MED ORDER — SUCCINYLCHOLINE CHLORIDE 200 MG/10ML IV SOSY
PREFILLED_SYRINGE | INTRAVENOUS | Status: DC | PRN
  Administered 2019-12-07: 100 mg via INTRAVENOUS

## 2019-12-07 MED ORDER — GABAPENTIN 300 MG PO CAPS
300.0000 mg | ORAL_CAPSULE | Freq: Every day | ORAL | Status: DC
  Administered 2019-12-07: 300 mg via ORAL
  Filled 2019-12-07: qty 1

## 2019-12-07 MED ORDER — ONDANSETRON HCL 4 MG PO TABS: 4.0000 mg | ORAL_TABLET | Freq: Four times a day (QID) | ORAL | Status: DC | PRN

## 2019-12-07 MED ORDER — CEFAZOLIN SODIUM-DEXTROSE 1-4 GM/50ML-% IV SOLN
1.0000 g | Freq: Three times a day (TID) | INTRAVENOUS | Status: DC
  Administered 2019-12-07 – 2019-12-08 (×2): 1 g via INTRAVENOUS
  Filled 2019-12-07 (×2): qty 50

## 2019-12-07 MED ORDER — THROMBIN 5000 UNITS EX SOLR
CUTANEOUS | Status: AC
  Filled 2019-12-07: qty 5000

## 2019-12-07 MED ORDER — HYDROCODONE-ACETAMINOPHEN 10-325 MG PO TABS
ORAL_TABLET | ORAL | Status: AC
  Filled 2019-12-07: qty 2

## 2019-12-07 MED ORDER — 0.9 % SODIUM CHLORIDE (POUR BTL) OPTIME
TOPICAL | Status: DC | PRN
  Administered 2019-12-07: 1000 mL

## 2019-12-07 MED ORDER — FENTANYL CITRATE (PF) 250 MCG/5ML IJ SOLN
INTRAMUSCULAR | Status: AC
  Filled 2019-12-07: qty 5

## 2019-12-07 MED ORDER — HYDROCODONE-ACETAMINOPHEN 5-325 MG PO TABS
1.0000 | ORAL_TABLET | Freq: Four times a day (QID) | ORAL | Status: DC | PRN
  Administered 2019-12-07 – 2019-12-08 (×4): 2 via ORAL
  Filled 2019-12-07 (×3): qty 2

## 2019-12-07 MED ORDER — HYDROMORPHONE HCL 1 MG/ML IJ SOLN
INTRAMUSCULAR | Status: AC
  Filled 2019-12-07: qty 0.5

## 2019-12-07 MED ORDER — LIDOCAINE 2% (20 MG/ML) 5 ML SYRINGE
INTRAMUSCULAR | Status: DC | PRN
  Administered 2019-12-07: 60 mg via INTRAVENOUS

## 2019-12-07 MED ORDER — THROMBIN 5000 UNITS EX SOLR
OROMUCOSAL | Status: DC | PRN
  Administered 2019-12-07: 5 mL

## 2019-12-07 MED ORDER — PHENOL 1.4 % MT LIQD: 1.0000 | OROMUCOSAL | Status: DC | PRN

## 2019-12-07 MED ORDER — INSULIN ASPART 100 UNIT/ML ~~LOC~~ SOLN: 0.0000 [IU] | Freq: Every day | SUBCUTANEOUS | Status: DC

## 2019-12-07 MED ORDER — SUGAMMADEX SODIUM 200 MG/2ML IV SOLN
INTRAVENOUS | Status: DC | PRN
  Administered 2019-12-07: 360 mg via INTRAVENOUS

## 2019-12-07 MED ORDER — ACETAMINOPHEN 325 MG PO TABS: 650.0000 mg | ORAL_TABLET | ORAL | Status: DC | PRN

## 2019-12-07 MED ORDER — CHLORHEXIDINE GLUCONATE CLOTH 2 % EX PADS: 6.0000 | MEDICATED_PAD | Freq: Once | CUTANEOUS | Status: DC

## 2019-12-07 MED ORDER — ORAL CARE MOUTH RINSE: 15.0000 mL | Freq: Once | OROMUCOSAL | Status: AC

## 2019-12-07 MED ORDER — PROPOFOL 500 MG/50ML IV EMUL
INTRAVENOUS | Status: AC
  Filled 2019-12-07: qty 50

## 2019-12-07 MED ORDER — ONDANSETRON HCL 4 MG/2ML IJ SOLN: 4.0000 mg | Freq: Once | INTRAMUSCULAR | Status: DC | PRN

## 2019-12-07 MED ORDER — ROCURONIUM BROMIDE 10 MG/ML (PF) SYRINGE
PREFILLED_SYRINGE | INTRAVENOUS | Status: DC | PRN
  Administered 2019-12-07: 50 mg via INTRAVENOUS
  Administered 2019-12-07: 20 mg via INTRAVENOUS

## 2019-12-07 MED ORDER — INSULIN ASPART 100 UNIT/ML ~~LOC~~ SOLN
0.0000 [IU] | Freq: Three times a day (TID) | SUBCUTANEOUS | Status: DC
  Administered 2019-12-08: 2 [IU] via SUBCUTANEOUS

## 2019-12-07 MED ORDER — KETAMINE HCL 50 MG/5ML IJ SOSY
PREFILLED_SYRINGE | INTRAMUSCULAR | Status: AC
  Filled 2019-12-07: qty 10

## 2019-12-07 MED ORDER — SUFENTANIL CITRATE 50 MCG/ML IV SOLN
INTRAVENOUS | Status: DC | PRN
  Administered 2019-12-07: 30 ug via INTRAVENOUS

## 2019-12-07 MED ORDER — EPHEDRINE SULFATE-NACL 50-0.9 MG/10ML-% IV SOSY
PREFILLED_SYRINGE | INTRAVENOUS | Status: DC | PRN
  Administered 2019-12-07: 10 mg via INTRAVENOUS

## 2019-12-07 MED ORDER — CHLORHEXIDINE GLUCONATE 0.12 % MT SOLN
15.0000 mL | Freq: Once | OROMUCOSAL | Status: AC
  Administered 2019-12-07: 15 mL via OROMUCOSAL
  Filled 2019-12-07: qty 15

## 2019-12-07 MED ORDER — POLYETHYLENE GLYCOL 3350 17 G PO PACK: 17.0000 g | PACK | Freq: Every day | ORAL | Status: DC | PRN

## 2019-12-07 MED ORDER — PROPOFOL 10 MG/ML IV BOLUS
INTRAVENOUS | Status: AC
  Filled 2019-12-07: qty 20

## 2019-12-07 MED ORDER — ACETAMINOPHEN 10 MG/ML IV SOLN
INTRAVENOUS | Status: AC
  Filled 2019-12-07: qty 100

## 2019-12-07 MED ORDER — MIDAZOLAM HCL 5 MG/5ML IJ SOLN
INTRAMUSCULAR | Status: DC | PRN
  Administered 2019-12-07: 2 mg via INTRAVENOUS

## 2019-12-07 SURGICAL SUPPLY — 76 items
ADH SKN CLS APL DERMABOND .7 (GAUZE/BANDAGES/DRESSINGS) ×2
BAND INSRT 18 STRL LF DISP RB (MISCELLANEOUS) ×4
BAND RUBBER #18 3X1/16 STRL (MISCELLANEOUS) ×8 IMPLANT
BLADE CLIPPER SURG (BLADE) IMPLANT
CANISTER SUCT 3000ML PPV (MISCELLANEOUS) ×4 IMPLANT
CAP PUSHER PTP (ORTHOPEDIC DISPOSABLE SUPPLIES) ×2 IMPLANT
CLIP SPRING STIM LLIF SAFEOP (CLIP) ×2 IMPLANT
COVER WAND RF STERILE (DRAPES) ×4 IMPLANT
DECANTER SPIKE VIAL GLASS SM (MISCELLANEOUS) ×4 IMPLANT
DERMABOND ADVANCED (GAUZE/BANDAGES/DRESSINGS) ×2
DERMABOND ADVANCED .7 DNX12 (GAUZE/BANDAGES/DRESSINGS) ×2 IMPLANT
DILATOR INSULATED LLIF 8-13-18 (NEUROSURGERY SUPPLIES) ×2 IMPLANT
DISSECTOR BLUNT TIP ENDO 5MM (MISCELLANEOUS) IMPLANT
DRAPE C-ARM 42X72 X-RAY (DRAPES) ×8 IMPLANT
DRAPE C-ARMOR (DRAPES) ×4 IMPLANT
DRAPE LAPAROTOMY 100X72X124 (DRAPES) ×4 IMPLANT
DRAPE SURG 17X23 STRL (DRAPES) ×4 IMPLANT
DURAPREP 26ML APPLICATOR (WOUND CARE) ×4 IMPLANT
ELECT BLADE INSULATED 6.5IN (ELECTROSURGICAL) ×4
ELECT COATED BLADE 2.86 ST (ELECTRODE) ×2 IMPLANT
ELECT KIT SAFEOP EMG/NMJ (KITS) ×4
ELECT REM PT RETURN 9FT ADLT (ELECTROSURGICAL) ×4
ELECTRODE BLDE INSULATED 6.5IN (ELECTROSURGICAL) ×2 IMPLANT
ELECTRODE REM PT RTRN 9FT ADLT (ELECTROSURGICAL) ×2 IMPLANT
GAUZE 4X4 16PLY RFD (DISPOSABLE) ×2 IMPLANT
GAUZE SPONGE 4X4 12PLY STRL (GAUZE/BANDAGES/DRESSINGS) IMPLANT
GLOVE BIOGEL PI IND STRL 7.5 (GLOVE) ×2 IMPLANT
GLOVE BIOGEL PI INDICATOR 7.5 (GLOVE) ×2
GLOVE ECLIPSE 7.5 STRL STRAW (GLOVE) ×4 IMPLANT
GLOVE EXAM NITRILE XL STR (GLOVE) IMPLANT
GOWN STRL REUS W/ TWL LRG LVL3 (GOWN DISPOSABLE) ×4 IMPLANT
GOWN STRL REUS W/ TWL XL LVL3 (GOWN DISPOSABLE) ×4 IMPLANT
GOWN STRL REUS W/TWL 2XL LVL3 (GOWN DISPOSABLE) IMPLANT
GOWN STRL REUS W/TWL LRG LVL3 (GOWN DISPOSABLE) ×8
GOWN STRL REUS W/TWL XL LVL3 (GOWN DISPOSABLE) ×8
GUIDEWIRE LLIF TT 310 (WIRE) ×2 IMPLANT
GUIDEWIRE THRD TIP NIT 18 (WIRE) ×8 IMPLANT
HEMOSTAT POWDER KIT SURGIFOAM (HEMOSTASIS) ×4 IMPLANT
KIT BASIN OR (CUSTOM PROCEDURE TRAY) ×4 IMPLANT
KIT EMG SRFC ELECT (KITS) IMPLANT
KIT INFUSE XX SMALL 0.7CC (Orthopedic Implant) ×2 IMPLANT
KIT TURNOVER KIT B (KITS) ×4 IMPLANT
KNIFE ANNULOTOMY (BLADE) ×2 IMPLANT
LIF ILLUMINATION SYSTEM STERIL (SYSTAGENIX WOUND MANAGEMENT) ×4
MATRIX SPINE STRIP NEOCORE 5CC (Putty) IMPLANT
MODULE POSITIONING LIF PTP (ORTHOPEDIC DISPOSABLE SUPPLIES) IMPLANT
NDL HYPO 18GX1.5 BLUNT FILL (NEEDLE) IMPLANT
NDL TARGETING DD BP 11X15 (NEEDLE) IMPLANT
NEEDLE HYPO 18GX1.5 BLUNT FILL (NEEDLE) IMPLANT
NEEDLE HYPO 22GX1.5 SAFETY (NEEDLE) ×4 IMPLANT
NEEDLE TARGETING DD BP 11X15 (NEEDLE) ×16 IMPLANT
NS IRRIG 1000ML POUR BTL (IV SOLUTION) ×4 IMPLANT
PACK LAMINECTOMY NEURO (CUSTOM PROCEDURE TRAY) ×4 IMPLANT
PAD ARMBOARD 7.5X6 YLW CONV (MISCELLANEOUS) ×12 IMPLANT
POSITIONING MODULE LIF PTP (ORTHOPEDIC DISPOSABLE SUPPLIES) ×4
PROBE BALL TIP LLIF SAFEOP (NEUROSURGERY SUPPLIES) ×2 IMPLANT
ROD LORD MIS 5.5X55 (Rod) ×4 IMPLANT
SCREW CANN MIS PA 6.5X45 (Screw) ×8 IMPLANT
SET SCREW (Screw) ×16 IMPLANT
SET SCREW SPNE (Screw) IMPLANT
SHIM INTRADISCAL PTP WIDE (ORTHOPEDIC DISPOSABLE SUPPLIES) ×2 IMPLANT
SPACER IDENTITI 10X22X55 10D (Spacer) ×2 IMPLANT
SPONGE LAP 4X18 RFD (DISPOSABLE) IMPLANT
SPONGE SURGIFOAM ABS GEL SZ50 (HEMOSTASIS) IMPLANT
SPONGE TONSIL TAPE 1 RFD (DISPOSABLE) IMPLANT
STAPLER VISISTAT 35W (STAPLE) ×4 IMPLANT
STRIP MATRIX NEOCORE 5CC (Putty) ×4 IMPLANT
SUT MNCRL AB 4-0 PS2 18 (SUTURE) ×4 IMPLANT
SUT VIC AB 0 CT1 18XCR BRD8 (SUTURE) ×2 IMPLANT
SUT VIC AB 0 CT1 8-18 (SUTURE) ×4
SUT VIC AB 2-0 CP2 18 (SUTURE) ×4 IMPLANT
SYSTEM ILLUMINATION LIF STERIL (SYSTAGENIX WOUND MANAGEMENT) IMPLANT
TOWEL GREEN STERILE (TOWEL DISPOSABLE) ×4 IMPLANT
TOWEL GREEN STERILE FF (TOWEL DISPOSABLE) ×4 IMPLANT
TRAY FOLEY MTR SLVR 16FR STAT (SET/KITS/TRAYS/PACK) ×4 IMPLANT
WATER STERILE IRR 1000ML POUR (IV SOLUTION) ×4 IMPLANT

## 2019-12-07 NOTE — Anesthesia Postprocedure Evaluation (Signed)
Anesthesia Post Note  Patient: ALIA PARSLEY  Procedure(s) Performed: PRONE TRANSPSOAS INTERBODY FUSION L3-L4 LEFT (Left Spine Lumbar) LUMBAR PERCUTANEOUS PEDICLE SCREW 1 LEVEL (N/A Spine Lumbar)     Patient location during evaluation: PACU Anesthesia Type: General Level of consciousness: awake and alert Pain management: pain level controlled Vital Signs Assessment: post-procedure vital signs reviewed and stable Respiratory status: spontaneous breathing, nonlabored ventilation, respiratory function stable and patient connected to nasal cannula oxygen Cardiovascular status: blood pressure returned to baseline and stable Postop Assessment: no apparent nausea or vomiting Anesthetic complications: no   No complications documented.  Last Vitals:  Vitals:   12/07/19 1433 12/07/19 1626  BP: 127/78 134/82  Pulse: 76 61  Resp: 18 16  Temp: 36.9 C 36.8 C  SpO2: 93% 95%    Last Pain:  Vitals:   12/07/19 1641  TempSrc:   PainSc: 6                  Anatasia Tino DAVID

## 2019-12-07 NOTE — Transfer of Care (Signed)
Immediate Anesthesia Transfer of Care Note  Patient: Melissa Morrison  Procedure(s) Performed: PRONE TRANSPSOAS INTERBODY FUSION L3-L4 LEFT (Left Spine Lumbar) LUMBAR PERCUTANEOUS PEDICLE SCREW 1 LEVEL (N/A Spine Lumbar)  Patient Location: PACU  Anesthesia Type:General  Level of Consciousness: awake, alert , oriented and patient cooperative  Airway & Oxygen Therapy: Patient Spontanous Breathing and Patient connected to face mask oxygen  Post-op Assessment: Report given to RN and Post -op Vital signs reviewed and stable  Post vital signs: Reviewed and stable  Last Vitals:  Vitals Value Taken Time  BP 135/81 12/07/19 1315  Temp    Pulse 102 12/07/19 1318  Resp 24 12/07/19 1318  SpO2 93 % 12/07/19 1318  Vitals shown include unvalidated device data.  Last Pain:  Vitals:   12/07/19 0717  PainSc: 5          Complications: No complications documented.

## 2019-12-07 NOTE — Anesthesia Procedure Notes (Signed)
Procedure Name: Intubation Date/Time: 12/07/2019 9:00 AM Performed by: Ponciano Ort, CRNA Pre-anesthesia Checklist: Patient identified, Emergency Drugs available, Suction available and Patient being monitored Patient Re-evaluated:Patient Re-evaluated prior to induction Oxygen Delivery Method: Circle system utilized Preoxygenation: Pre-oxygenation with 100% oxygen Induction Type: IV induction Ventilation: Mask ventilation without difficulty Laryngoscope Size: Miller and 2 Grade View: Grade I Tube type: Oral Tube size: 7.0 mm Number of attempts: 1 Airway Equipment and Method: Stylet and Oral airway Placement Confirmation: ETT inserted through vocal cords under direct vision,  positive ETCO2 and breath sounds checked- equal and bilateral Secured at: 21 cm Tube secured with: Tape Dental Injury: Teeth and Oropharynx as per pre-operative assessment

## 2019-12-07 NOTE — Progress Notes (Signed)
Orthopedic Tech Progress Note Patient Details:  Melissa Morrison 05/20/1960 [id number] Called in order to [facility] for an ASPEN LUMBAR BRACE Patient ID: BREYONA SWANDER, female   DOB: 1960/07/28, 58 y.o.   MRN: [id number]   Donald Pore 12/07/2019, 1:45 PM

## 2019-12-07 NOTE — Anesthesia Preprocedure Evaluation (Signed)
Anesthesia Evaluation  Patient identified by MRN, date of birth, ID band Patient awake    Reviewed: Allergy & Precautions, NPO status , Patient's Chart, lab work & pertinent test results  History of Anesthesia Complications (+) PONV  Airway Mallampati: I  TM Distance: >3 FB Neck ROM: Full    Dental   Pulmonary Current Smoker and Patient abstained from smoking.,    Pulmonary exam normal        Cardiovascular hypertension, Pt. on medications Normal cardiovascular exam     Neuro/Psych    GI/Hepatic GERD  Medicated and Controlled,  Endo/Other  diabetes, Type 2, Oral Hypoglycemic Agents  Renal/GU      Musculoskeletal   Abdominal   Peds  Hematology   Anesthesia Other Findings   Reproductive/Obstetrics                             Anesthesia Physical Anesthesia Plan  ASA: II  Anesthesia Plan: General   Post-op Pain Management:    Induction: Intravenous  PONV Risk Score and Plan: 3 and Ondansetron, Midazolam and Dexamethasone  Airway Management Planned: Oral ETT  Additional Equipment:   Intra-op Plan:   Post-operative Plan: Extubation in OR  Informed Consent: I have reviewed the patients History and Physical, chart, labs and discussed the procedure including the risks, benefits and alternatives for the proposed anesthesia with the patient or authorized representative who has indicated his/her understanding and acceptance.       Plan Discussed with: CRNA and Surgeon  Anesthesia Plan Comments:         Anesthesia Quick Evaluation

## 2019-12-07 NOTE — H&P (Signed)
CC: back pain and right leg pain  HPI:     Patient is a 59 y.o. female with hx of ACDF, DM who complains of back pain and right leg pain that persisted despite nonsurgical measures.  After discussion of treatment options, she wished to proceed with surgery.    Patient Active Problem List   Diagnosis Date Noted  . UTI symptoms 12/02/2018  . Family history of premature coronary artery disease 11/25/2017  . Peripheral vascular disease (HCC) 11/21/2016  . Encounter for smoking cessation counseling 10/13/2016  . Current every day smoker 10/13/2016  . Bilateral carpal tunnel syndrome 11/07/2015  . Irritable bowel syndrome 11/07/2015  . Migraine headache 11/07/2015  . Urinary incontinence 11/07/2015  . Benign hypertension 05/08/2015  . Mixed hyperlipidemia 05/08/2015  . Type 2 diabetes mellitus without complication, without long-term current use of insulin (HCC) 05/08/2015   Past Medical History:  Diagnosis Date  . Anemia   . Arthritis    neck and spine  . Diabetes mellitus without complication (HCC)   . GERD (gastroesophageal reflux disease)   . Headache    migraines  . History of kidney stones   . Hypertension   . IBS (irritable bowel syndrome)   . Peripheral vascular disease (HCC)   . PONV (postoperative nausea and vomiting)     Past Surgical History:  Procedure Laterality Date  . ABDOMINAL HYSTERECTOMY  1985   Partial  . ANTERIOR FUSION CERVICAL SPINE    . APPENDECTOMY    . bone growth stimulator removal  1997   originally implanted 1992  . CARPAL TUNNEL RELEASE Left 1998  . CHOLECYSTECTOMY  2008  . CYSTOCELE REPAIR  2010  . DILATION AND CURETTAGE, DIAGNOSTIC / THERAPEUTIC  1983  . EYE SURGERY Bilateral 2006   cataract removal with lens placement  . nerve endings clipped  1993   in back of head to reduce migraines. Done by Dr. Gasper Sells.  Marland Kitchen POSTERIOR FUSION CERVICAL SPINE    . RECTOCELE REPAIR  2010    Medications Prior to Admission  Medication Sig Dispense  Refill Last Dose  . amLODipine (NORVASC) 5 MG tablet Take 5 mg by mouth daily.   12/07/2019 at 0500  . atorvastatin (LIPITOR) 40 MG tablet Take 40 mg by mouth daily at 6 PM.    12/06/2019 at Unknown time  . carvedilol (COREG) 12.5 MG tablet Take 12.5 mg by mouth every 12 (twelve) hours.    12/07/2019 at 0500  . cycloSPORINE (RESTASIS) 0.05 % ophthalmic emulsion Place 1 drop into both eyes 2 (two) times daily.    12/07/2019 at Unknown time  . fluticasone (FLONASE) 50 MCG/ACT nasal spray Place 1 spray into both nostrils daily.    12/07/2019 at 0500  . gabapentin (NEURONTIN) 300 MG capsule Take 300 mg by mouth daily at 6 PM.    12/06/2019 at Unknown time  . HYDROcodone-acetaminophen (NORCO/VICODIN) 5-325 MG tablet Take 1 tablet by mouth 2 (two) times daily as needed (pain.).    12/06/2019  . metFORMIN (GLUCOPHAGE) 1000 MG tablet Take 1,000 mg by mouth every 12 (twelve) hours.    12/06/2019 at Unknown time  . NUCYNTA ER 100 MG 12 hr tablet Take 100 mg by mouth every 12 (twelve) hours.   12/07/2019 at 0500  . sitaGLIPtin (JANUVIA) 100 MG tablet Take 100 mg by mouth daily with breakfast.    12/06/2019 at Unknown time  . tiZANidine (ZANAFLEX) 2 MG tablet Take 2 mg by mouth in the morning, at noon, and  at bedtime.    12/06/2019 at Unknown time  . aspirin EC 81 MG tablet Take 81 mg by mouth daily. Swallow whole.   11/30/2019   Allergies  Allergen Reactions  . Lisinopril Anaphylaxis  . Naproxen Anaphylaxis  . Codeine Hives  . Sumatriptan Nausea And Vomiting  . Colesevelam Rash  . Rosuvastatin Nausea Only    rechallenged    Social History   Tobacco Use  . Smoking status: Current Every Day Smoker    Packs/day: 0.25    Years: 40.00    Pack years: 10.00    Types: Cigarettes  . Smokeless tobacco: Never Used  . Tobacco comment: < 1/2 pk/day  Substance Use Topics  . Alcohol use: Not Currently    Alcohol/week: 0.0 standard drinks    History reviewed. No pertinent family history.   Review of  Systems Pertinent items noted in HPI and remainder of comprehensive ROS otherwise negative.  Objective:   Patient Vitals for the past 8 hrs:  BP Temp Pulse Resp SpO2 Height Weight  12/07/19 0654 129/80 98 F (36.7 C) 69 18 97 % 5\' 5"  (1.651 m) 67.2 kg   No intake/output data recorded. No intake/output data recorded.    NAD [place]/at Neck supple Breathing comfortably abd soft Ext wwp 5/5 strength HF, KE, DF, PF bilaterally  Data Review CT myelogram shows L3-4 spondylolisthesis, foraminal stenosis severe on R  Assessment:   L3-4 spondylolisthesis with foraminal stenosis and accompanying radiculopathy  Plan:   - plan for L3-4 DLIF today

## 2019-12-07 NOTE — Op Note (Signed)
Procedure(s): PRONE TRANSPSOAS INTERBODY FUSION L3-L4 LEFT LUMBAR PERCUTANEOUS PEDICLE SCREW 1 LEVEL Procedure Note  Melissa Morrison female 59 y.o. 12/07/2019  Procedure(s) and Anesthesia Type:    * PRONE TRANSPSOAS INTERBODY FUSION L3-L4 LEFT - General    * LUMBAR PERCUTANEOUS PEDICLE SCREW 1 LEVEL - General  Surgeon(s) and Role:    Maisie Fus, Coy Saunas, MD - Primary    Coletta Memos, MD - Assisting   Indications: This is a 59 year old woman with diabetes and chronic pain who had progressively worsening back pain and right leg pain consistent with L3 radiculopathy.  She was found to have L3-4 spondylolisthesis with severe right foraminal stenosis on CT myelogram.  As her pain was progressing despite nonsurgical measures, she wished to proceed with surgical intervention.  I discussed various options with her including simple decompression and foraminotomy versus interbody fusion.  Given the significant component of back pain, she opted for the latter treatment option.  Risks, benefits, alternatives, and expected convalescence were discussed with her.  This discussed included, but were not limited to, bleeding, pain, infection, scar, pseudoarthrosis, neurologic deficit, spinal fluid leak, injury to intra-abdominal organs, and death.  Informed consent was obtained.     Surgeon: Bedelia Person   Assistants: Coletta Memos, MD. Please note there were no qualified trainees available to assist with the case.  Assistance was required with positioning of the lateral retractor.  Anesthesia: General endotracheal anesthesia   Procedure Detail  1. Direct lateral interbody fusion at L3-4 via left transpsoas approach in prone position 2. Placement of titanium interbody cage at L3-4 3. Posterior nonsegmental instrumentation with percutaneous pedicle screws L3-4 4. Use of morselized allograft  Patient was brought to the operating.  General anesthesia was induced and patient was intubated by the  anesthesia service.  Patient positioned prone on the [facility] table with [employer] supports with all pressure points padded and eyes protected.  Preoperative x-rays were used to place patient in a perfectly prone position.  Incisions for percutaneous pedicle screws were marked on the skin using AP x-rays.  Using lateral x-rays, a incision along Langerhans skin lines was planned over the L3-4 disc space.  The left flank and back were preprepped with alcohol and prepped and draped in sterile fashion.  A timeout was performed.  Preoperative antibiotics and dexamethasone were administered.  1% lidocaine with epinephrine injected in the planned incision.  Incision was made in the left flank with a 10 blade and the subcutaneous tissue was divided down to the external obliques.  The external oblique fibers were bluntly separated, followed by the internal oblique fibers, and the transversalis was then opened for entry into the retroperitoneal space.  Retroperitoneal dissection was performed with the finger going from the quadratus lumborum to the transverse process and the psoas was palpated.  The abdominal contents were reflected anteriorly using the finger.  Initial dilator was then placed over the L3-4 displaced under lateral C-arm guidance.  The dilator was stimulated and no nerves on the surface of the psoas were detected.  The initial dilator was used to penetrate through the psoas and docked on the disc space.  X-ray confirmed good placement and stimulation revealed no nerves in the proximity.  K wire was then placed into the disc space just posterior to the midpoint of the disc space.  Second and third dilators were passed over the initial dilator and stimulated to confirm no nerves in proximity.  The transpsoas retractor was then passed over the last  dilator and secured in position with C-arm x-ray confirming good position.  The dilator was removed and the space was inspected.  The operative field was then stimulated  with no nerves seen or able to be stimulated.  The posterior retractor blade was then opened until x-ray confirmed good positioning.  Additional stimulation revealed no nerves in the vicinity and a shim was then placed in the disc space to walk the retractor in place.  The anterior blade was then opened, and again stimulation revealed no nerves in the vicinity.  Small amount of anterior muscle creep was swept forward and held in place with a retractor extension with anterior blade.  The disc was then incised with a 15 blade and discectomy was performed with pituitary rongeurs.  Cobbs were used to release the disc from the endplates and the contralateral annulus under AP C-arm guidance.  Box cutter was used under AP C-arm guidance to remove disc across the disc space.  Various curettes and rasp were used to prepare the interbody space for implantation.  Escalating size trials were placed in the interbody space under x-ray guidance.  A size 14 mm anterior height but 10 mm posterior height appeared to have the best fit.  A 14 x 22 x 55 mm titanium cage was packed with new core and extra extra small BMP and was malleted into the interbody space under x-ray guidance.  The contralateral collapsed foramen height was significantly improved with the interbody.  Lateral x-ray confirmed good placement in the disc space and good lordosis.  The shim was removed and the retractor was removed with no evidence of any bleeding.  Final x-rays were taken.  The external oblique fascia was closed with 0 Vicryl stitches with 2-0 Vicryl to close the dermal layer.  Skin was closed with 4-0 Monocryl in subcuticular manner followed by Dermabond.  Attention was then turned to the percutaneous pedicle screws.  Stab incisions were made at the the planned skin incisions and Jamshidi needles were passed under AP guidance through the pedicles of L3 and L4.  Lateral x-rays confirmed entry into the body and K wires were placed in the Jamshidi's.   6.5 x 45 mm screws were then passed over the K wires under fluoroscopic guidance with good purchase noted.  55 mm rods were then passed through the screw towers.  The bottom screw towers were final tightened and the towers were distracted with the distraction device to help open the foraminal space further as well as to apply compressive force across the interbody implant under fluoroscopic guidance.  The top screws were then final locked into place.  The screw towers were removed and AP and lateral x-rays confirmed good placement of instrumentation.  The wounds were irrigated thoroughly with bacitracin variegation.  Fascia was closed with 0 Vicryl for all 4 small incisions.  The dermal layer was closed with 2-0 Vicryl stitches in buried fashion followed by 4 Monocryl in subcuticular manner.  Dermabond was used over the posterior incisions.  Patient was then flipped supine and extubated by the anesthesia service.  All counts were correct at the end of surgery.  No complications were noted. Findings: Successful foraminal height restoration, partial reduction of spondylolisthesis  Estimated Blood Loss:  25 ml         Drains: none         Total IV Fluids: see anesthesia records  Blood Given: none          Specimens: none  Implants:  Alpha-Tec 14 mm x 22 mm x 55 mm lordotic titanium cage xxs BMP Neocore 10 ml 6.5 x 45 mm pedicle screws bilaterally at L3-4        Complications:  * No complications entered in OR log *         Disposition: PACU - hemodynamically stable.         Condition: stable

## 2019-12-08 LAB — GLUCOSE, CAPILLARY: Glucose-Capillary: 138 mg/dL — ABNORMAL HIGH (ref 70–99)

## 2019-12-08 MED ORDER — TIZANIDINE HCL 2 MG PO TABS: 2.0000 mg | ORAL_TABLET | Freq: Four times a day (QID) | ORAL | 0 refills | Status: AC | PRN

## 2019-12-08 MED ORDER — HYDROCODONE-ACETAMINOPHEN 5-325 MG PO TABS: 1.0000 | ORAL_TABLET | Freq: Four times a day (QID) | ORAL | 0 refills | Status: AC | PRN

## 2019-12-08 MED ORDER — DOCUSATE SODIUM 100 MG PO CAPS: 100.0000 mg | ORAL_CAPSULE | Freq: Two times a day (BID) | ORAL | 2 refills | Status: AC

## 2019-12-08 MED ORDER — DIAZEPAM 5 MG PO TABS: 5.0000 mg | ORAL_TABLET | Freq: Three times a day (TID) | ORAL | Status: DC | PRN

## 2019-12-08 MED ORDER — DIAZEPAM 5 MG PO TABS: 5.0000 mg | ORAL_TABLET | Freq: Three times a day (TID) | ORAL | 0 refills | Status: AC | PRN

## 2019-12-08 NOTE — Discharge Summary (Signed)
Physician Discharge Summary  Patient ID: Melissa Morrison MRN: [id number] DOB/AGE: Mar 22, 1960 59 y.o.  Admit date: 12/07/2019 Discharge date: 12/08/2019  Admission Diagnoses:  Lumbar radiculopathy  Discharge Diagnoses:  Same Active Problems:   Lumbar radiculopathy   Discharged Condition: Stable  Hospital Course:  Melissa Morrison is a 59 y.o. female with diabetes and a history of ACDF who developed right L3 radiculopathy and back pain that persisted despite nonsurgical measures.  She was found to have L3-4 disc degeneration with asymmetric collapse on the right side as well as spondylolisthesis.  She underwent elective L3-4 DLIF with posterior percutaneous instrumentation and was admitted postoperatively to the spine unit.  There, she was mobilized with the help of physical therapy and Occupational Therapy.  On the day of discharge, she was tolerating a regular diet with pain well controlled with p.o. pain medications and was voiding without difficulty and ambulating well.  She was deemed ready for discharge home.  Treatments: Surgery -L3-4 DLIF with percutaneous posterior instrumentation  Discharge Exam: Blood pressure (!) 158/77, pulse 66, temperature 97.7 F (36.5 C), temperature source Oral, resp. rate 18, height 5\' 5"  (1.651 m), weight 67.2 kg, SpO2 94 %. Awake, alert, oriented Speech fluent, appropriate CN grossly intact 5/5 BUE/BLE, except left HF 3/5 No anterior thigh numbness noted Bruising around left flank incision and posterior incisions, Wounds c/d/i  Disposition: Discharge disposition: 01-Home or Self Care       Discharge Instructions    Incentive spirometry RT   Complete by: As directed      Allergies as of 12/08/2019      Reactions   Lisinopril Anaphylaxis   Naproxen Anaphylaxis   Codeine Hives   Sumatriptan Nausea And Vomiting   Colesevelam Rash   Rosuvastatin Nausea Only   rechallenged      Medication List    TAKE these medications    amLODipine 5 MG tablet Commonly known as: NORVASC Take 5 mg by mouth daily.   aspirin EC 81 MG tablet Take 81 mg by mouth daily. Swallow whole.   atorvastatin 40 MG tablet Commonly known as: LIPITOR Take 40 mg by mouth daily at 6 PM.   carvedilol 12.5 MG tablet Commonly known as: COREG Take 12.5 mg by mouth 2 (two) times daily with a meal.   cycloSPORINE 0.05 % ophthalmic emulsion Commonly known as: RESTASIS Place 1 drop into both eyes 2 (two) times daily.   docusate sodium 100 MG capsule Commonly known as: COLACE Take 1 capsule (100 mg total) by mouth 2 (two) times daily.   fluticasone 50 MCG/ACT nasal spray Commonly known as: FLONASE Place 1 spray into both nostrils in the morning and at bedtime.   gabapentin 300 MG capsule Commonly known as: NEURONTIN Take 300 mg by mouth at bedtime.   HYDROcodone-acetaminophen 5-325 MG tablet Commonly known as: NORCO/VICODIN Take 1-2 tablets by mouth every 6 (six) hours as needed for moderate pain or severe pain (pain.). What changed:   how much to take  when to take this  reasons to take this   metFORMIN 1000 MG tablet Commonly known as: GLUCOPHAGE Take 1,000 mg by mouth 2 (two) times daily with a meal.   Nucynta ER 100 MG 12 hr tablet Generic drug: tapentadol Take 100 mg by mouth every 12 (twelve) hours.   sitaGLIPtin 100 MG tablet Commonly known as: JANUVIA Take 100 mg by mouth at bedtime.   tiZANidine 2 MG tablet Commonly known as: ZANAFLEX Take 2 mg by mouth in the  morning, at noon, and at bedtime. What changed: Another medication with the same name was added. Make sure you understand how and when to take each.   tiZANidine 2 MG tablet Commonly known as: ZANAFLEX Take 1 tablet (2 mg total) by mouth every 6 (six) hours as needed for muscle spasms. What changed: You were already taking a medication with the same name, and this prescription was added. Make sure you understand how and when to take each.             Durable Medical Equipment  (From admission, onward)         Start     Ordered   12/08/19 1048  For home use only DME 3 n 1  Once        12/08/19 1047   12/08/19 1048  For home use only DME Walker  Once       Question:  Patient needs a walker to treat with the following condition  Answer:  S/P lumbar spinal fusion   12/08/19 1047          Follow-up Information    Bedelia Person, MD Follow up in 2 week(s).   Specialty: Neurosurgery Contact information: [address] [place] [address] [phone]               Signed: Bedelia Person 12/08/2019, 11:00 AM

## 2019-12-08 NOTE — Evaluation (Signed)
Physical Therapy Evaluation Patient Details Name: Melissa Morrison MRN: [id number] DOB: 01-27-1961 Today's Date: 12/08/2019   History of Present Illness  This is a 59 year old woman with diabetes and chronic pain who had progressively worsening back pain and right leg pain consistent with L3 radiculopathy.  She was found to have L3-4 spondylolisthesis.Procedure(s) Performed: PRONE TRANSPSOAS INTERBODY FUSION L3-L4.  Clinical Impression  Patient is s/p above surgery resulting in the deficits listed below (see PT Problem List). Pt requiring min guard for safety with transfers and ambulation due to muscle spasms.  She demonstrated good adherence to back precautions with cues. She was educated on transfer and gait techniques to help relief pain.  Pt has support at home but would benefit from [facility] and RW for improved safety with mobility.  She will benefit from acute PT to advance mobility while hospitalized but demonstrates mobility necessary to return home with family from PT perspective.  Patient will benefit from skilled PT to increase their independence and safety with mobility (while adhering to their precautions) to allow discharge to the venue listed below.     Follow Up Recommendations Follow surgeon's recommendation for DC plan and follow-up therapies;Supervision/Assistance - 24 hour    Equipment Recommendations  Rolling walker with 5" wheels;3in1 (PT)    Recommendations for Other Services       Precautions / Restrictions Precautions Precautions: Back Precaution Booklet Issued: Yes (comment) Precaution Comments: Educated on precautions and brace Required Braces or Orthoses: Spinal Brace Spinal Brace: Lumbar corset;Applied in sitting position Restrictions Weight Bearing Restrictions: No      Mobility  Bed Mobility Overal bed mobility: Needs Assistance Bed Mobility: Rolling;Sidelying to Sit;Sit to Sidelying Rolling: Supervision Sidelying to sit: Supervision     Sit to  sidelying: Min assist General bed mobility comments: Increased time with cues for log roll; min A for L leg back into bed  Transfers Overall transfer level: Needs assistance Equipment used: Rolling walker (2 wheeled) Transfers: Sit to/from Stand Sit to Stand: Min guard Stand pivot transfers: Supervision       General transfer comment: min guard for safety; reports spasms  Ambulation/Gait Ambulation/Gait assistance: Min guard Gait Distance (Feet): 150 Feet Assistive device: Rolling walker (2 wheeled) Gait Pattern/deviations: Step-to pattern;Step-through pattern;Decreased stride length Gait velocity: decreased   General Gait Details: Pt c/o pain and spasms in standing worse with L LE weight bearing.  Pt initially taking longer steps cued for small steps and then for step to L for pain control.  Also , educated on RW proximity and setitng RW height.  Stairs            Wheelchair Mobility    Modified Rankin (Stroke Patients Only)       Balance Overall balance assessment: Modified Independent Sitting-balance support: No upper extremity supported Sitting balance-Leahy Scale: Good     Standing balance support: No upper extremity supported;Bilateral upper extremity supported Standing balance-Leahy Scale: Fair Standing balance comment: Was able to stand without UE support but utilized RW with gait due to spasms and acuity of sx                             Pertinent Vitals/Pain Pain Assessment: 0-10 Pain Score: 7  (7/10 walking, 6/10 resting) Pain Location: Low back Pain Descriptors / Indicators: Spasm;Throbbing Pain Intervention(s): Limited activity within patient's tolerance;Monitored during session;Repositioned;Relaxation;Other (comment) (transfer and gait techniques to ease pain)    Home Living Family/patient expects to be discharged  to:: Private residence Living Arrangements: Spouse/significant other Available Help at Discharge: Family;Available 24  hours/day Type of Home: House Home Access: Level entry Entrance Stairs-Rails: None Entrance Stairs-Number of Steps: 1 Home Layout: One level Home Equipment: Tub bench Additional Comments: long reacher and sponge    Prior Function Level of Independence: Independent with assistive device(s)         Comments: Pt was independent using cane.  Could perform ADLs and community ambulation.  Worked at [employer]   Dominant Hand: Right    Extremity/Trunk Assessment   Upper Extremity Assessment Upper Extremity Assessment: Overall WFL for tasks assessed    Lower Extremity Assessment Lower Extremity Assessment: Overall WFL for tasks assessed (ROM WFL; MMT at least 3/5 but not further tested due to acuity of sx)    Cervical / Trunk Assessment Cervical / Trunk Assessment: Other exceptions Cervical / Trunk Exceptions: Lumbar brace when OOB; maintained back precautions  Communication   Communication: No difficulties  Cognition Arousal/Alertness: Awake/alert Behavior During Therapy: WFL for tasks assessed/performed Overall Cognitive Status: Within Functional Limits for tasks assessed                                        General Comments General comments (skin integrity, edema, etc.): VSS; pt educated on back precautions, transfer and gait techniques, and resting positions    Exercises     Assessment/Plan    PT Assessment Patient needs continued PT services  PT Problem List Decreased strength;Decreased mobility;Decreased range of motion;Decreased coordination;Decreased knowledge of precautions;Decreased activity tolerance;Decreased knowledge of use of DME;Pain;Decreased balance       PT Treatment Interventions DME instruction;Therapeutic activities;Modalities;Gait training;Therapeutic exercise;Patient/family education;Balance training;Functional mobility training    PT Goals (Current goals can be found in the Care Plan  section)  Acute Rehab PT Goals Patient Stated Goal: Return home and decrease pain PT Goal Formulation: With patient/family Time For Goal Achievement: 12/22/19 Potential to Achieve Goals: Good    Frequency Min 5X/week   Barriers to discharge        Co-evaluation               AM-PAC PT "6 Clicks" Mobility  Outcome Measure Help needed turning from your back to your side while in a flat bed without using bedrails?: None Help needed moving from lying on your back to sitting on the side of a flat bed without using bedrails?: None Help needed moving to and from a bed to a chair (including a wheelchair)?: A Little Help needed standing up from a chair using your arms (e.g., wheelchair or bedside chair)?: A Little Help needed to walk in hospital room?: A Little Help needed climbing 3-5 steps with a railing? : A Little 6 Click Score: 20    End of Session Equipment Utilized During Treatment: Gait belt;Back brace Activity Tolerance: Patient tolerated treatment well Patient left: in bed;with call bell/phone within reach;with family/visitor present Nurse Communication: Mobility status PT Visit Diagnosis: Other abnormalities of gait and mobility (R26.89);Pain Pain - part of body:  (back)    Time: 7510-2585 PT Time Calculation (min) (ACUTE ONLY): 24 min   Charges:   PT Evaluation $PT Eval Moderate Complexity: 1 Mod PT Treatments $Gait Training: 8-22 mins        Anise Salvo, PT Acute Rehab Services Pager [phone] [facility] [phone]    Rayetta Humphrey 12/08/2019,  11:27 AM

## 2019-12-08 NOTE — Evaluation (Signed)
Occupational Therapy Evaluation Patient Details Name: Melissa Morrison MRN: [id number] DOB: Oct 07, 1960 Today's Date: 12/08/2019    History of Present Illness This is a 59 year old woman with diabetes and chronic pain who had progressively worsening back pain and right leg pain consistent with L3 radiculopathy.  She was found to have L3-4 spondylolisthesis.Procedure(s) Performed: PRONE TRANSPSOAS INTERBODY FUSION L3-L4.   Clinical Impression   Patient admitted with the above diagnosis.  Presents with low back spasms which are impacting ADL and functional mobility status.  At home she occasionally used a [facility] for mobility, but was largely independent with ADL, meals and home management.  It is anticipated that she will regain independence with healing and a reduction of surgical related pain.  No further acute OT needs.  No HH services recommended.  Patient is requesting a BSC and RW.      Follow Up Recommendations  No OT follow up    Equipment Recommendations  3 in 1 bedside commode    Recommendations for Other Services       Precautions / Restrictions Precautions Precautions: Back Precaution Booklet Issued: Yes (comment) Required Braces or Orthoses: Spinal Brace Spinal Brace: Lumbar corset;Applied in sitting position Restrictions Weight Bearing Restrictions: No      Mobility Bed Mobility Overal bed mobility: Modified Independent             General bed mobility comments: HOB elevated and use of SR's  Transfers Overall transfer level: Needs assistance Equipment used: Rolling walker (2 wheeled) Transfers: Sit to/from [employer] Sit to Stand: Supervision Stand pivot transfers: Supervision       General transfer comment: spasms.  No LOB noted    Balance Overall balance assessment: No apparent balance deficits (not formally assessed)                                         ADL either performed or assessed with clinical judgement    ADL Overall ADL's : Needs assistance/impaired Eating/Feeding: Independent;Sitting   Grooming: Independent;Standing   Upper Body Bathing: Independent;Sitting   Lower Body Bathing: Minimal assistance;Sitting/lateral leans   Upper Body Dressing : Supervision/safety;Standing   Lower Body Dressing: Minimal assistance;Sitting/lateral leans   Toilet Transfer: RW;Supervision/safety   Toileting- Clothing Manipulation and Hygiene: Independent       Functional mobility during ADLs: Supervision/safety General ADL Comments: spasms to low back impact perfromance     Vision Baseline Vision/History: Wears glasses Patient Visual Report: No change from baseline       Perception     Praxis      Pertinent Vitals/Pain Pain Assessment: 0-10 Pain Score: 10-Worst pain ever Pain Location: Low back Pain Descriptors / Indicators: Spasm Pain Intervention(s): Monitored during session;Repositioned     Hand Dominance Right   Extremity/Trunk Assessment Upper Extremity Assessment Upper Extremity Assessment: Overall WFL for tasks assessed   Lower Extremity Assessment Lower Extremity Assessment: Overall WFL for tasks assessed   Cervical / Trunk Assessment Cervical / Trunk Assessment: Normal   Communication Communication Communication: No difficulties   Cognition Arousal/Alertness: Awake/alert Behavior During Therapy: WFL for tasks assessed/performed Overall Cognitive Status: Within Functional Limits for tasks assessed  Home Living Family/patient expects to be discharged to:: Private residence   Available Help at Discharge: Family Type of Home: House Home Access: Stairs to enter Secretary/administrator of Steps: 1 Entrance Stairs-Rails: None Home Layout: One level     Bathroom Shower/Tub: Chief Strategy Officer: Standard Bathroom Accessibility: Yes How Accessible: Accessible via  walker Home Equipment: Tub bench   Additional Comments: long reacher and sponge      Prior Functioning/Environment Level of Independence: Independent with assistive device(s)                 OT Problem List: Pain      OT Treatment/Interventions:      OT Goals(Current goals can be found in the care plan section) Acute Rehab OT Goals Patient Stated Goal: Return home and decrease pain OT Goal Formulation: With patient Time For Goal Achievement: 12/15/19 Potential to Achieve Goals: Good  OT Frequency:     Barriers to D/C:  none          Co-evaluation              AM-PAC OT "6 Clicks" Daily Activity     Outcome Measure Help from another person eating meals?: None Help from another person taking care of personal grooming?: None Help from another person toileting, which includes using toliet, bedpan, or urinal?: None Help from another person bathing (including washing, rinsing, drying)?: A Little Help from another person to put on and taking off regular upper body clothing?: None Help from another person to put on and taking off regular lower body clothing?: A Little 6 Click Score: 22   End of Session Equipment Utilized During Treatment: Rolling walker Nurse Communication: Mobility status  Activity Tolerance: Patient limited by pain Patient left: in bed;with call bell/phone within reach  OT Visit Diagnosis: Pain Pain - part of body:  (back)                Time: 1610-9604 OT Time Calculation (min): 28 min Charges:  OT General Charges $OT Visit: 1 Visit OT Evaluation $OT Eval Moderate Complexity: 1 Mod  12/08/2019  Melissa Morrison, OTR/L  Acute Rehabilitation Services  Office:  [phone]   Melissa Morrison 12/08/2019, 9:17 AM

## 2019-12-08 NOTE — Progress Notes (Signed)
Patient is discharged from room 3C09 at this time. Alert and in stable condition. IV site d/c'd and instructions read to patient and spouse with understanding verbalized and all questions answered. Left unit via wheelchair with all belongings at side. .  

## 2019-12-08 NOTE — Discharge Instructions (Signed)

## 2019-12-12 ENCOUNTER — Encounter (HOSPITAL_COMMUNITY): Payer: Self-pay | Admitting: Neurosurgery

## 2021-11-18 IMAGING — RF DG C-ARM 1-60 MIN
1 series · 2 of 2 positions shown · non-contrast
Comparison: CT myelogram lumbar spine 05/16/2019

CLINICAL DATA: Lumbar fusion

EXAM:
LUMBAR SPINE - 2-3 VIEW; DG C-ARM 1-60 MIN

[Series 1: run · 2 of 2 slices shown]
[im 1/2]
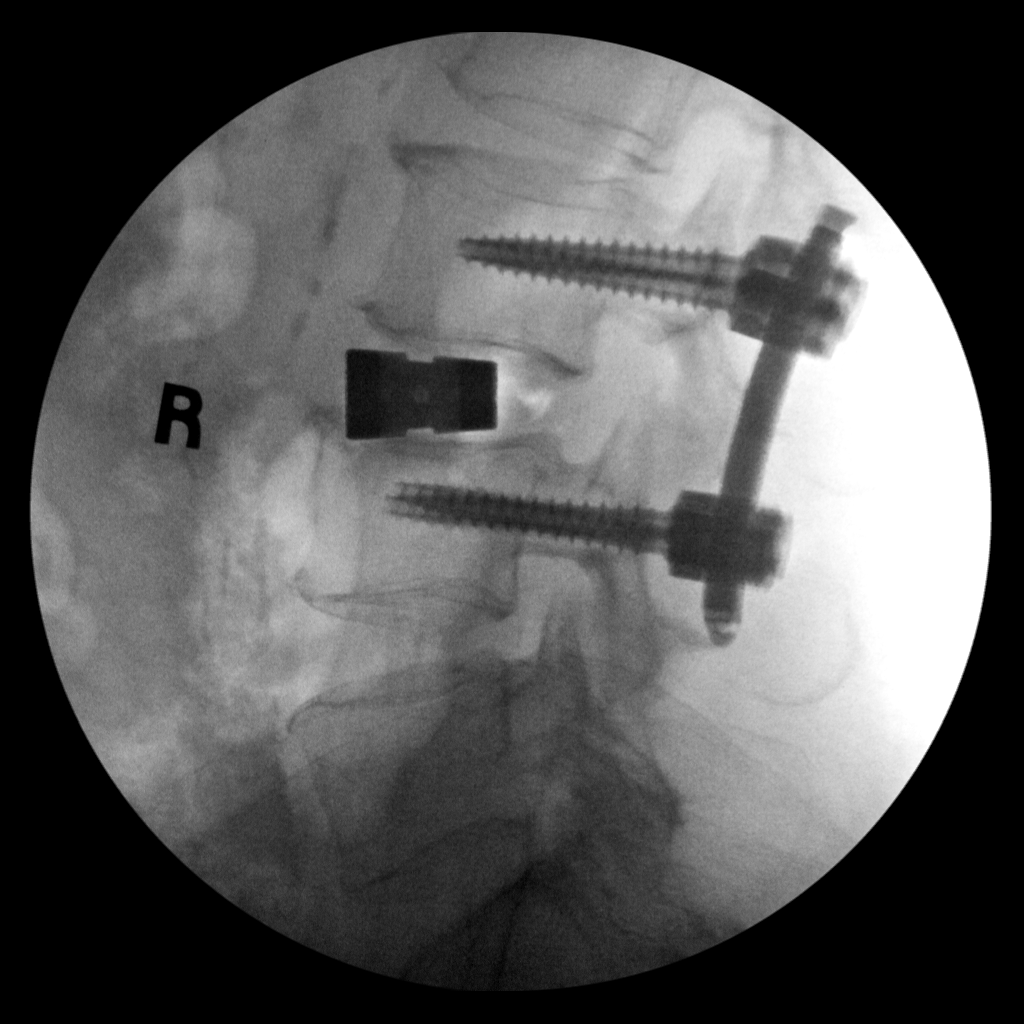
[im 2/2]
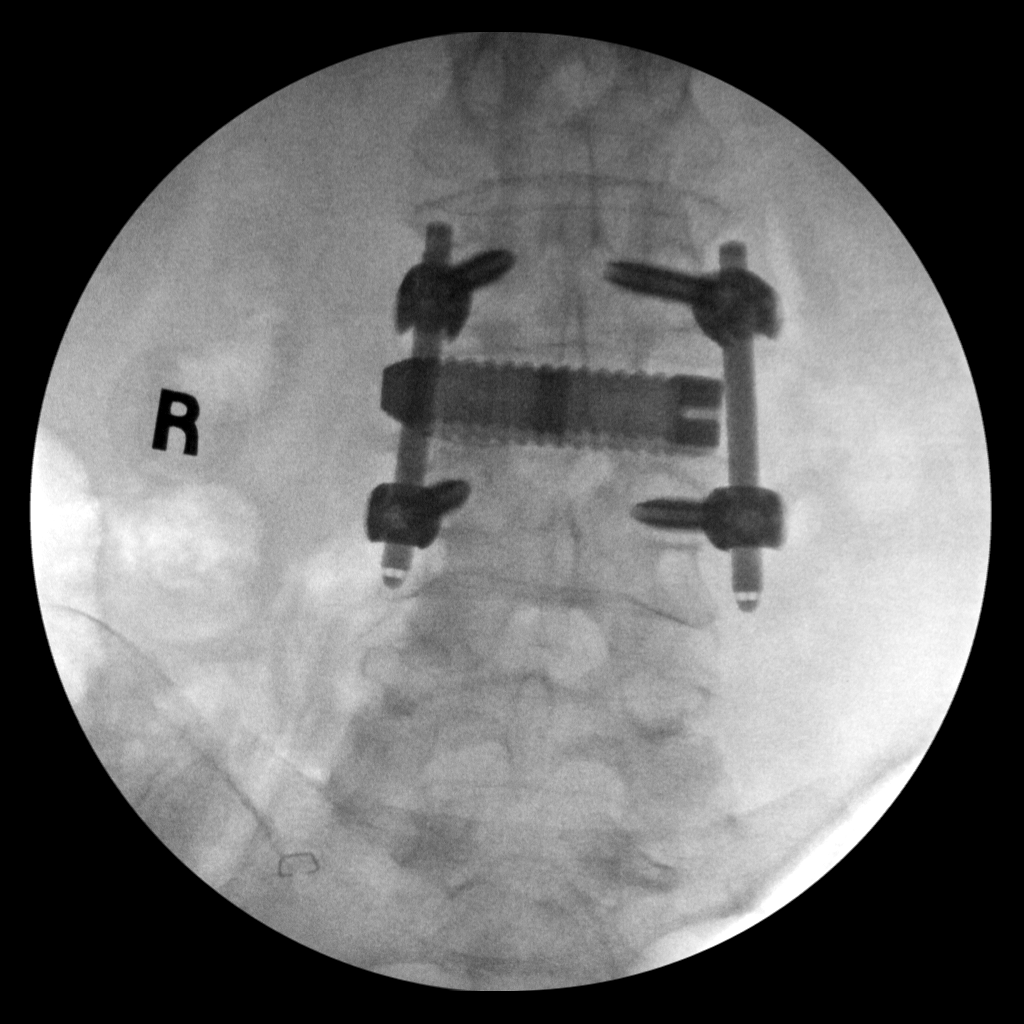

[2 of 2 positions shown; findings below may reference images not displayed]

FINDINGS: AP and lateral C-arm images lumbar spine. Bilateral pedicle screw
and interbody fusion L3-4 disc space. Hardware in satisfactory
position.
IMPRESSION: Pedicle screw and interbody fusion L3-4 without complication.

## 2021-11-18 IMAGING — RF DG LUMBAR SPINE 2-3V
1 series · 2 of 2 positions shown · non-contrast
Comparison: 06/28/2019

CLINICAL DATA: L3-4 fusion. Assess for retained instrument, needle,
or sponge

EXAM:
LUMBAR SPINE - 2-3 VIEW

[Series 1: run · 2 of 2 slices shown]
[im 1/2]
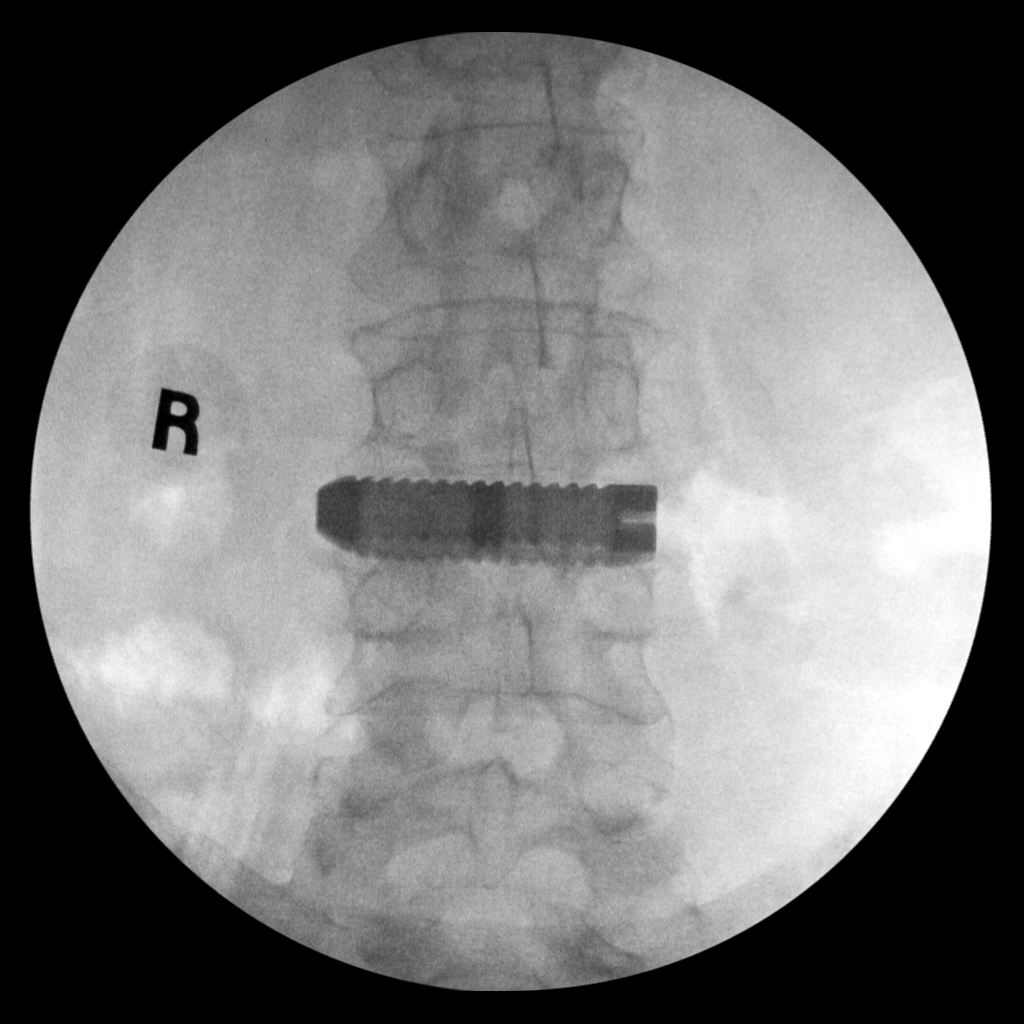
[im 2/2]
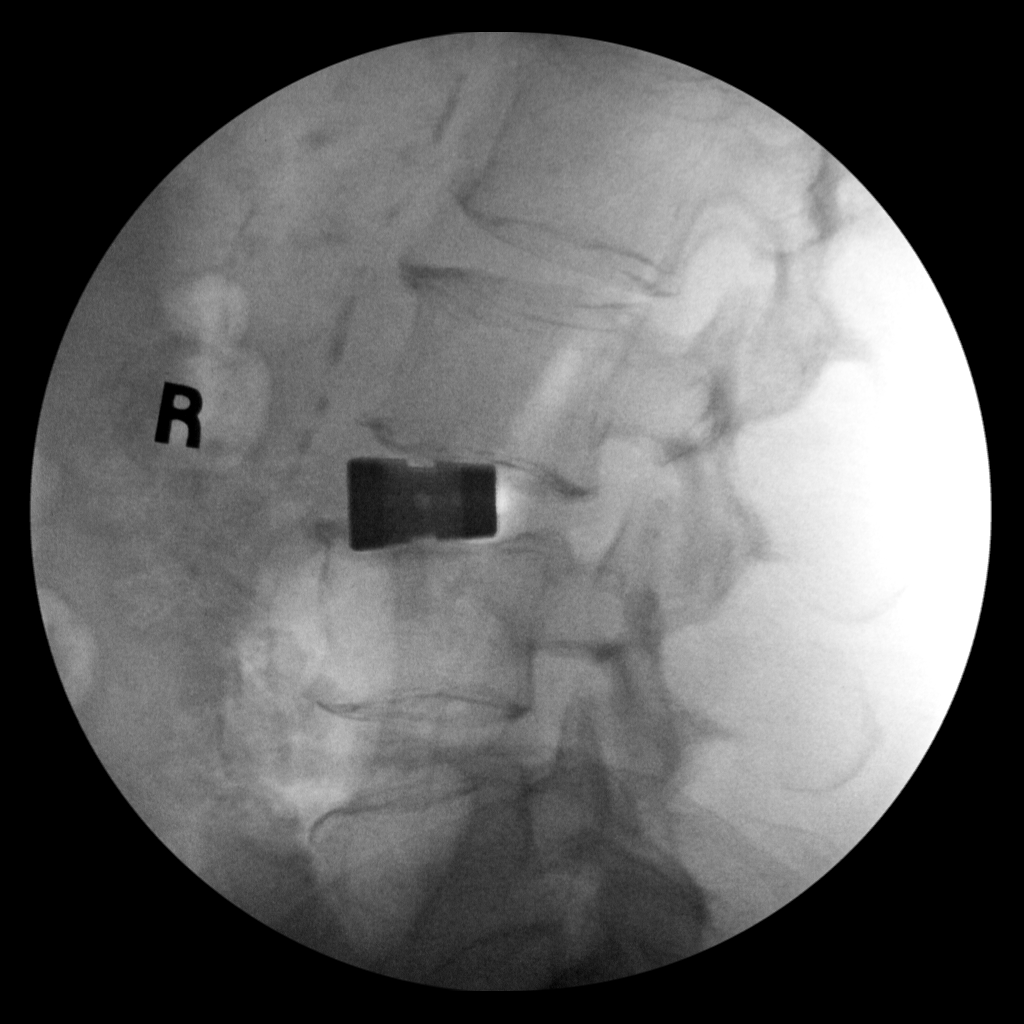

[2 of 2 positions shown; findings below may reference images not displayed]

FINDINGS: AP and lateral intraoperative images of the lumbar spine coned in at
the L3-4 level demonstrates interbody fusion hardware. No additional
radiopaque foreign bodies within the field of view to suggest a
retained surgical instrument.
IMPRESSION: Intraoperative images of L3-4 interbody fusion. No additional
radiopaque foreign bodies within the field of view to suggest a
retained surgical instrument.

These results were called by telephone at the time of interpretation
on 12/07/2019 at [DATE] to OR team member Berhane, who verbally
acknowledged these results.

## 2021-11-18 IMAGING — RF DG LUMBAR SPINE 2-3V
1 series · 2 of 2 positions shown · non-contrast
Comparison: CT myelogram lumbar spine 05/16/2019

CLINICAL DATA: Lumbar fusion

EXAM:
LUMBAR SPINE - 2-3 VIEW; DG C-ARM 1-60 MIN

[Series 1: run · 2 of 2 slices shown]
[im 1/2]
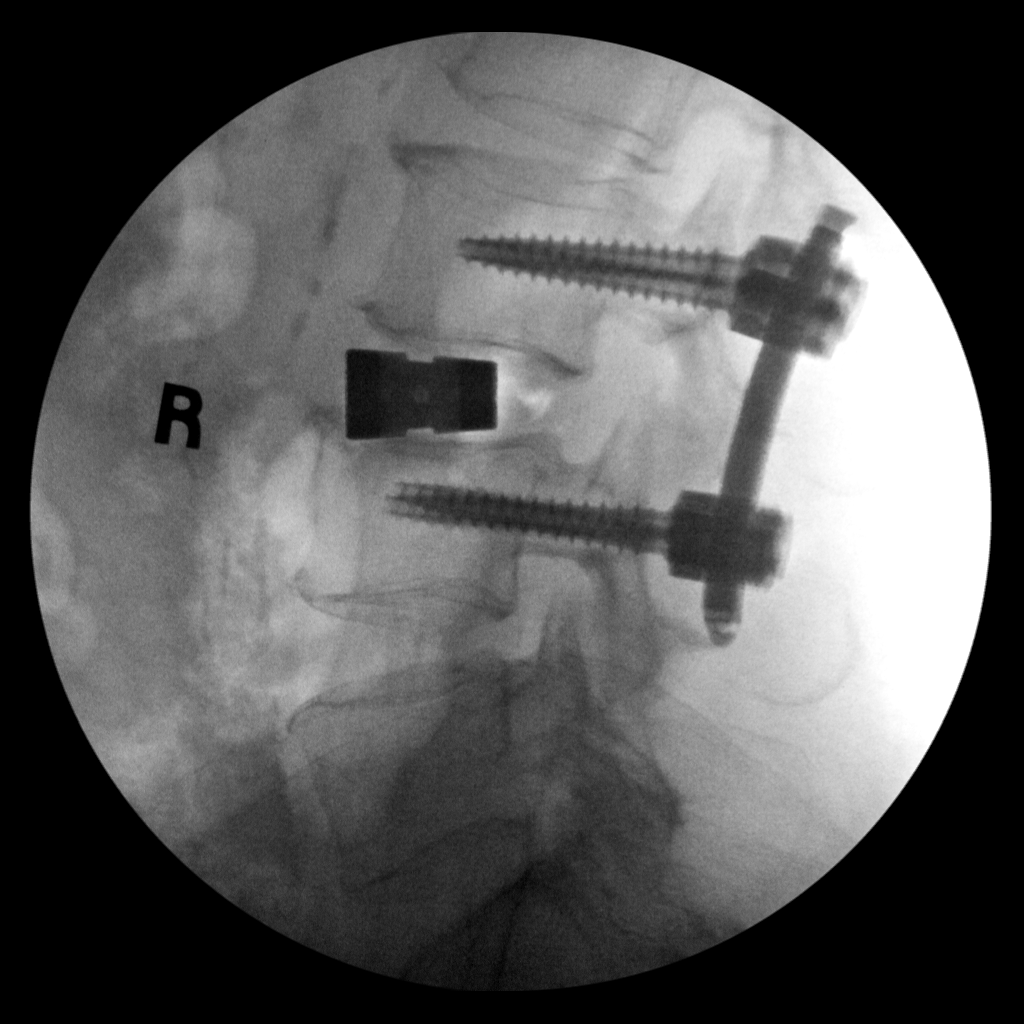
[im 2/2]
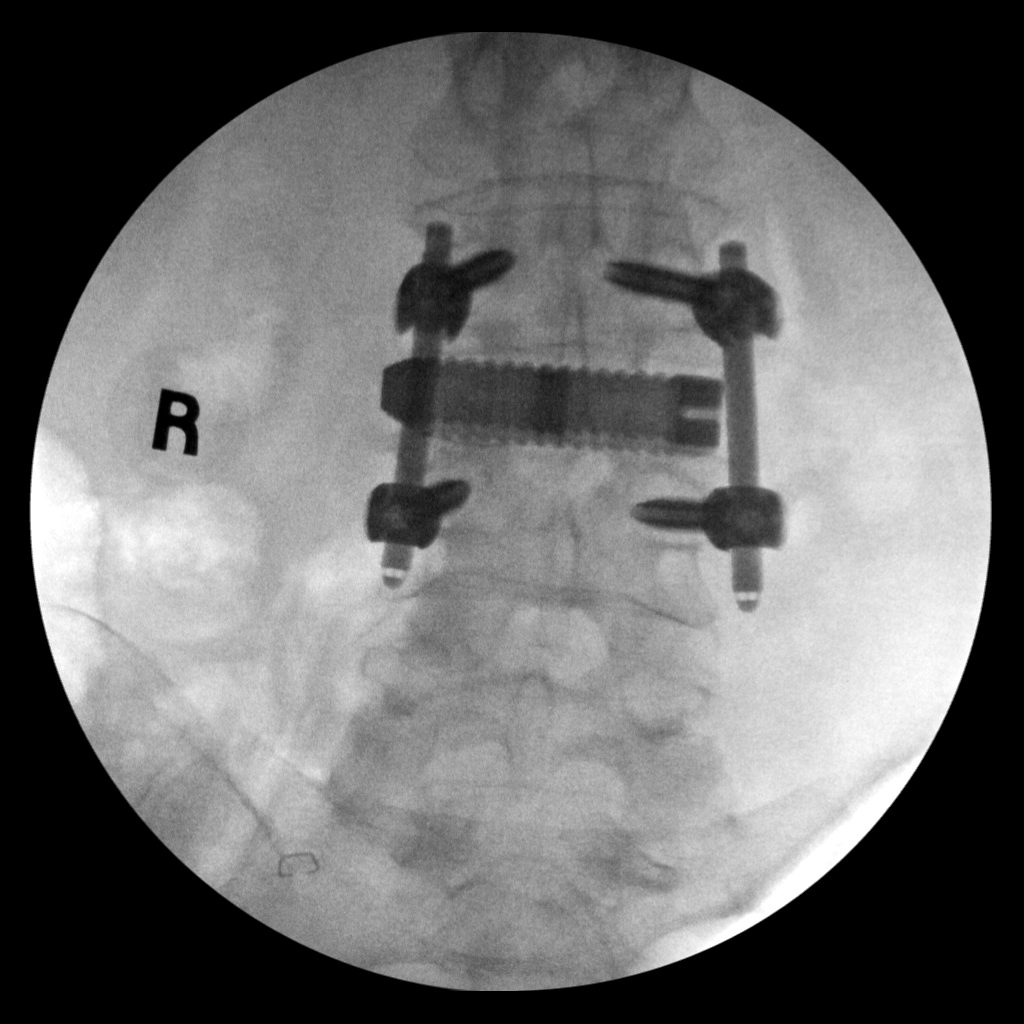

[2 of 2 positions shown; findings below may reference images not displayed]

FINDINGS: AP and lateral C-arm images lumbar spine. Bilateral pedicle screw
and interbody fusion L3-4 disc space. Hardware in satisfactory
position.
IMPRESSION: Pedicle screw and interbody fusion L3-4 without complication.

## 2023-05-02 ENCOUNTER — Ambulatory Visit (HOSPITAL_BASED_OUTPATIENT_CLINIC_OR_DEPARTMENT_OTHER): Admission: EM | Admit: 2023-05-02 | Discharge: 2023-05-02 | Disposition: A | Discharge status: Home / Self Care

## 2023-05-02 ENCOUNTER — Encounter (HOSPITAL_BASED_OUTPATIENT_CLINIC_OR_DEPARTMENT_OTHER): Payer: Self-pay | Admitting: Emergency Medicine

## 2023-05-02 DIAGNOSIS — M26629 Arthralgia of temporomandibular joint, unspecified side: Secondary | ICD-10-CM | POA: Diagnosis not present

## 2023-05-02 DIAGNOSIS — J3489 Other specified disorders of nose and nasal sinuses: Secondary | ICD-10-CM | POA: Diagnosis not present

## 2023-05-02 MED ORDER — DICLOFENAC SODIUM 50 MG PO TBEC: 50.0000 mg | DELAYED_RELEASE_TABLET | Freq: Two times a day (BID) | ORAL | 0 refills | Status: AC | PRN

## 2023-05-02 NOTE — ED Provider Notes (Signed)
 [facility]    CSN: [id number] Arrival date & time: 05/02/23  1533      History   Chief Complaint Chief Complaint  Patient presents with   Otalgia    HPI Melissa Morrison is a 63 y.o. female.   Patient reports she has had right ear pain and some left ear crinkling since Monday (04/27/23).  She had a mild fever of 99.0 on Wednesday (04/29/23).  She thinks she has ear infections.  She has some sinus pressure/pain.  She is trying to stop smoking.   Otalgia Associated symptoms: fever   Associated symptoms: no abdominal pain, no cough, no diarrhea, no rash, no sore throat and no vomiting     Past Medical History:  Diagnosis Date   Anemia    Arthritis    neck and spine   Diabetes mellitus without complication (HCC)    GERD (gastroesophageal reflux disease)    Headache    migraines   History of kidney stones    Hypertension    IBS (irritable bowel syndrome)    Peripheral vascular disease (HCC)    PONV (postoperative nausea and vomiting)     Patient Active Problem List   Diagnosis Date Noted   Lumbar radiculopathy 12/07/2019   UTI symptoms 12/02/2018   Family history of premature coronary artery disease 11/25/2017   Peripheral vascular disease (HCC) 11/21/2016   Encounter for smoking cessation counseling 10/13/2016   Current every day smoker 10/13/2016   Bilateral carpal tunnel syndrome 11/07/2015   Irritable bowel syndrome 11/07/2015   Migraine headache 11/07/2015   Urinary incontinence 11/07/2015   Benign hypertension 05/08/2015   Mixed hyperlipidemia 05/08/2015   Type 2 diabetes mellitus without complication, without long-term current use of insulin (HCC) 05/08/2015    Past Surgical History:  Procedure Laterality Date   ABDOMINAL HYSTERECTOMY  1985   Partial   ANTERIOR FUSION CERVICAL SPINE     ANTERIOR LAT LUMBAR FUSION Left 12/07/2019   Procedure: PRONE TRANSPSOAS INTERBODY FUSION L3-L4 LEFT;  Surgeon: Bedelia Person, MD;  Location: [facility] OR;   Service: Neurosurgery;  Laterality: Left;  PRONE TRANSPSOAS INTERBODY FUSION L3-L4 LEFT   APPENDECTOMY     bone growth stimulator removal  1997   originally implanted 1992   CARPAL TUNNEL RELEASE Left 1998   CHOLECYSTECTOMY  2008   CYSTOCELE REPAIR  2010   DILATION AND CURETTAGE, DIAGNOSTIC / THERAPEUTIC  1983   EYE SURGERY Bilateral 2006   cataract removal with lens placement   LUMBAR PERCUTANEOUS PEDICLE SCREW 1 LEVEL N/A 12/07/2019   Procedure: LUMBAR PERCUTANEOUS PEDICLE SCREW 1 LEVEL;  Surgeon: Bedelia Person, MD;  Location: [facility] OR;  Service: Neurosurgery;  Laterality: N/A;  LUMBAR PERCUTANEOUS PEDICLE SCREW 1 LEVEL   nerve endings clipped  1993   in back of head to reduce migraines. Done by Dr. Gasper Sells.   POSTERIOR FUSION CERVICAL SPINE     RECTOCELE REPAIR  2010    OB History   No obstetric history on file.      Home Medications    Prior to Admission medications   Medication Sig Start Date End Date Taking? Authorizing Provider  atorvastatin (LIPITOR) 40 MG tablet Take 40 mg by mouth daily at 6 PM.    Yes [provider]  carvedilol (COREG) 12.5 MG tablet Take 12.5 mg by mouth 2 (two) times daily with a meal.    Yes [provider]  diclofenac (VOLTAREN) 50 MG EC tablet Take 1 tablet (50 mg  total) by mouth 2 (two) times daily as needed for up to 15 days (TMJ pain). 05/02/23 05/17/23 Yes Prescilla Sours, FNP  DULoxetine (CYMBALTA) 30 MG capsule Take 30 mg by mouth daily. 08/07/20  Yes [provider]  fluticasone (FLONASE) 50 MCG/ACT nasal spray Place 1 spray into both nostrils in the morning and at bedtime.    Yes [provider]  gabapentin (NEURONTIN) 300 MG capsule Take 300 mg by mouth at bedtime.  10/14/19  Yes [provider]  metFORMIN (GLUCOPHAGE) 1000 MG tablet Take 1,000 mg by mouth 2 (two) times daily with a meal.    Yes [provider]  sitaGLIPtin (JANUVIA) 100 MG tablet Take 100 mg by mouth at bedtime.    Yes  [provider]  amLODipine (NORVASC) 5 MG tablet Take 5 mg by mouth daily.    [provider]  aspirin EC 81 MG tablet Take 81 mg by mouth daily. Swallow whole.   Yes [provider]  BELBUCA 150 MCG FILM SMARTSIG:1 Strip(s) By Mouth Every 12 Hours    [provider]  cycloSPORINE (RESTASIS) 0.05 % ophthalmic emulsion Place 1 drop into both eyes 2 (two) times daily.     [provider]  diazepam (VALIUM) 5 MG tablet Take 1 tablet (5 mg total) by mouth every 8 (eight) hours as needed for muscle spasms. 12/08/19   Bedelia Person, MD  diltiazem (CARDIZEM CD) 180 MG 24 hr capsule Take 180 mg by mouth every morning.    [provider]  docusate sodium (COLACE) 100 MG capsule Take 1 capsule (100 mg total) by mouth 2 (two) times daily. 12/08/19   Bedelia Person, MD  HYDROcodone-acetaminophen (NORCO/VICODIN) 5-325 MG tablet Take 1-2 tablets by mouth every 6 (six) hours as needed for moderate pain or severe pain (pain.). 12/08/19  Yes Bedelia Person, MD  NUCYNTA ER 100 MG 12 hr tablet Take 100 mg by mouth every 12 (twelve) hours. 09/17/19   [provider]  tiZANidine (ZANAFLEX) 2 MG tablet Take 2 mg by mouth in the morning, at noon, and at bedtime.     [provider]  tiZANidine (ZANAFLEX) 2 MG tablet Take 1 tablet (2 mg total) by mouth every 6 (six) hours as needed for muscle spasms. 12/08/19  Yes Bedelia Person, MD    Family History History reviewed. No pertinent family history.  Social History Social History   Tobacco Use   Smoking status: Every Day    Current packs/day: 0.25    Average packs/day: 0.3 packs/day for 40.0 years (10.0 ttl pk-yrs)    Types: Cigarettes   Smokeless tobacco: Never   Tobacco comments:    < 1/2 pk/day  Vaping Use   Vaping status: Never Used  Substance Use Topics   Alcohol use: Not Currently    Alcohol/week: 0.0 standard drinks of alcohol   Drug use: Never     Allergies    Lisinopril, Naproxen, Codeine, Sumatriptan, Colesevelam, and Rosuvastatin   Review of Systems Review of Systems  Constitutional:  Positive for fever. Negative for chills.  HENT:  Positive for ear pain. Negative for sore throat.   Eyes:  Negative for pain and visual disturbance.  Respiratory:  Negative for cough and shortness of breath.   Cardiovascular:  Negative for chest pain and palpitations.  Gastrointestinal:  Negative for abdominal pain, constipation, diarrhea, nausea and vomiting.  Genitourinary:  Negative for dysuria and hematuria.  Musculoskeletal:  Negative for arthralgias and back pain.  Skin:  Negative for color change and rash.  Neurological:  Negative for seizures and syncope.  All other systems reviewed and are negative.    Physical Exam Triage Vital Signs ED Triage Vitals [05/02/23 1543]  Encounter Vitals Group     BP      Systolic BP Percentile      Diastolic BP Percentile      Pulse      Resp      Temp      Temp src      SpO2      Weight      Height      Head Circumference      Peak Flow      Pain Score 3     Pain Loc      Pain Education      Exclude from Growth Chart    No data found.  Updated Vital Signs BP (!) 142/91 (BP Location: Left Arm)   Pulse 72   Temp 98 F (36.7 C) (Oral)   Resp 18   SpO2 95%   Visual Acuity Right Eye Distance:   Left Eye Distance:   Bilateral Distance:    Right Eye Near:   Left Eye Near:    Bilateral Near:     Physical Exam Vitals and nursing note reviewed.  Constitutional:      General: She is not in acute distress.    Appearance: She is well-developed. She is not ill-appearing or toxic-appearing.  HENT:     Head: Normocephalic and atraumatic.     Jaw: Tenderness (bilateral TMJ Pain (R>L)) and pain on movement (bilateral TMJ Pain (R>L)) present.     Right Ear: Hearing, tympanic membrane, ear canal and external ear normal.     Left Ear: Hearing, tympanic membrane, ear canal and external ear normal.      Nose: No congestion or rhinorrhea.     Right Sinus: Maxillary sinus tenderness (mild pressure) present. No frontal sinus tenderness.     Left Sinus: Maxillary sinus tenderness (mild pressure) present. No frontal sinus tenderness.     Mouth/Throat:     Lips: Pink.     Mouth: Mucous membranes are moist.     Pharynx: Uvula midline. No oropharyngeal exudate or posterior oropharyngeal erythema.     Tonsils: No tonsillar exudate.  Eyes:     Conjunctiva/sclera: Conjunctivae normal.     Pupils: Pupils are equal, round, and reactive to light.  Cardiovascular:     Rate and Rhythm: Normal rate and regular rhythm.     Heart sounds: S1 normal and S2 normal. No murmur heard. Pulmonary:     Effort: Pulmonary effort is normal. No respiratory distress.     Breath sounds: Normal breath sounds. No decreased breath sounds, wheezing, rhonchi or rales.  Abdominal:     General: Bowel sounds are normal.     Palpations: Abdomen is soft.     Tenderness: There is no abdominal tenderness.  Musculoskeletal:        General: No swelling.     Cervical back: Neck supple.  Lymphadenopathy:     Head:     Right side of head: No submental, submandibular, tonsillar, preauricular or posterior auricular adenopathy.     Left side of head: No submental, submandibular, tonsillar, preauricular or posterior auricular adenopathy.     Cervical: No cervical adenopathy.     Right cervical: No superficial cervical adenopathy.    Left cervical: No superficial cervical adenopathy.  Skin:  General: Skin is warm and dry.     Capillary Refill: Capillary refill takes less than 2 seconds.     Findings: No rash.  Neurological:     Mental Status: She is alert and oriented to person, place, and time.  Psychiatric:        Mood and Affect: Mood normal.      UC Treatments / Results  Labs (all labs ordered are listed, but only abnormal results are displayed) Comprehensive Metabolic Panel: 12/09/22 Order:  [id number] Component Ref Range & Units 4 mo ago  Sodium 136 - 145 mmol/L 138  Potassium 3.5 - 5.1 mmol/L 3.7  Comment: NO VISIBLE HEMOLYSIS  Chloride 98 - 107 mmol/L 101  CO2 21 - 31 mmol/L 24  Anion Gap 6 - 14 mmol/L 13  Glucose, Random 70 - 99 mg/dL 409 High   Blood Urea Nitrogen (BUN) 7 - 25 mg/dL 13  Creatinine 8.11 - 9.14 mg/dL 7.82  eGFR >95 AO/ZHY/8.65H8 68  Comment: GFR estimated by CKD-EPI equations(NKF 2021).  "Recommend confirmation of Cr-based eGFR by using Cys-based eGFR and other filtration markers (if applicable) in complex cases and clinical decision-making, as needed."  Albumin 3.5 - 5.7 g/dL 4.5  Total Protein 6.4 - 8.9 g/dL 7.9  Bilirubin, Total 0.3 - 1.0 mg/dL 0.4  Alkaline Phosphatase (ALP) 34 - 104 U/L 58  Aspartate Aminotransferase (AST) 13 - 39 U/L 10 Low   Alanine Aminotransferase (ALT) 7 - 52 U/L 8  Calcium 8.6 - 10.3 mg/dL 9.4    EKG   Radiology No results found.  Procedures Procedures (including critical care time)  Medications Ordered in UC Medications - No data to display  Initial Impression / Assessment and Plan / UC Course  I have reviewed the triage vital signs and the nursing notes.  Pertinent labs & imaging results that were available during my care of the patient were reviewed by me and considered in my medical decision making (see chart for details).     Ears are normal on exam.  She has TMJ pain bilaterally but much worse on the right side.  She has dentures and no teeth.  Discussed possible use of steroids but due to diabetes she does not want to take any steroids.  Encouraged to get her dentures evaluated in case they are malaligned and giving her the TMJ pain.  She has some mild sinus pressure but mostly normal mucus she says sometimes it is slightly yellow-tinged.  She did have a low-grade fever several days ago but none today nor yesterday.  She is trying to quit smoking.  I do not believe she has bacterial  sinusitis at this point.  Encouraged OTC fluticasone or Flonase 1 spray into each nostril once daily for nasal congestion.  She has some Flonase at home.  Follow-up if symptoms do not improve, worsen or new symptoms occur.  If symptoms persist for more than 1 to 2 weeks may need antibiotics for sinusitis.  For now she is going to try the flu take his own or Flonase and also sinus rinses.  Provided diclofenac, 50 mg every 12 hours with food if needed for TMJ pain. Final Clinical Impressions(s) / UC Diagnoses   Final diagnoses:  TMJ pain dysfunction syndrome  Sinus pressure     Discharge Instructions      Patient has TMJ pain this causing her ear pain.  Offered nonsteroidal anti-inflammatory and she is willing to try that.  Will try diclofenac, 50 mg, every 12 hours  with food if needed for TMJ pain.  She already has muscle relaxants and narcotic pain pills for other pain management needs.  She does have some mild maxillary sinus pressure but no colored nasal discharge.  Encouraged sinus rinses and use of OTC Flonase nasal spray as a way to open up her sinuses.  Due to diabetes she does not want to try any steroids.  I think it is too early for her to need antibiotics right now.  But if her symptoms persist or worsen she might need antibiotics in 1 to 3 weeks.  Return here if symptoms do not improve, worsen or new symptoms occur.     ED Prescriptions     Medication Sig Dispense Auth. Provider   diclofenac (VOLTAREN) 50 MG EC tablet Take 1 tablet (50 mg total) by mouth 2 (two) times daily as needed for up to 15 days (TMJ pain). 30 tablet Prescilla Sours, FNP      I have reviewed the PDMP during this encounter.   Prescilla Sours, FNP 05/02/23 [phone]

## 2023-05-02 NOTE — ED Triage Notes (Signed)
 Pt reports pain to her right ear, and her left ear feels like paper in it started Monday, fever of 99 started on Wednesday.

## 2023-05-02 NOTE — Discharge Instructions (Addendum)
 Patient has TMJ pain this causing her ear pain.  Offered nonsteroidal anti-inflammatory and she is willing to try that.  Will try diclofenac, 50 mg, every 12 hours with food if needed for TMJ pain.  She already has muscle relaxants and narcotic pain pills for other pain management needs.  She does have some mild maxillary sinus pressure but no colored nasal discharge.  Encouraged sinus rinses and use of OTC Flonase nasal spray as a way to open up her sinuses.  Due to diabetes she does not want to try any steroids.  I think it is too early for her to need antibiotics right now.  But if her symptoms persist or worsen she might need antibiotics in 1 to 3 weeks.  Return here if symptoms do not improve, worsen or new symptoms occur.

## 2023-08-19 ENCOUNTER — Other Ambulatory Visit: Payer: Self-pay | Admitting: Physical Medicine and Rehabilitation

## 2023-08-19 DIAGNOSIS — M5412 Radiculopathy, cervical region: Secondary | ICD-10-CM

## 2023-08-25 ENCOUNTER — Ambulatory Visit
Admission: RE | Admit: 2023-08-25 | Discharge: 2023-08-25 | Disposition: A | Discharge status: Home / Self Care | Source: Ambulatory Visit | Attending: Physical Medicine and Rehabilitation | Admitting: Physical Medicine and Rehabilitation

## 2023-08-25 DIAGNOSIS — M5412 Radiculopathy, cervical region: Secondary | ICD-10-CM

## 2023-08-28 ENCOUNTER — Ambulatory Visit (HOSPITAL_BASED_OUTPATIENT_CLINIC_OR_DEPARTMENT_OTHER): Admission: EM | Admit: 2023-08-28 | Discharge: 2023-08-28 | Disposition: A | Discharge status: Home / Self Care

## 2023-08-28 ENCOUNTER — Encounter (HOSPITAL_BASED_OUTPATIENT_CLINIC_OR_DEPARTMENT_OTHER): Payer: Self-pay

## 2023-08-28 DIAGNOSIS — L568 Other specified acute skin changes due to ultraviolet radiation: Secondary | ICD-10-CM

## 2023-08-28 DIAGNOSIS — G43809 Other migraine, not intractable, without status migrainosus: Secondary | ICD-10-CM

## 2023-08-28 MED ORDER — KETOROLAC TROMETHAMINE 30 MG/ML IJ SOLN
30.0000 mg | Freq: Once | INTRAMUSCULAR | Status: AC
  Administered 2023-08-28: 30 mg via INTRAMUSCULAR

## 2023-08-28 MED ORDER — KETOROLAC TROMETHAMINE 10 MG PO TABS
10.0000 mg | ORAL_TABLET | Freq: Four times a day (QID) | ORAL | 0 refills | Status: AC | PRN
Start: 2023-08-28 — End: ?

## 2023-08-28 NOTE — Discharge Instructions (Signed)
 Migraine headache with photosensitivity: Ketorolac  30 mg injection in the office today (this is an NSAID).  Provided a printed prescription for ketorolac  to use if needed.  Encouraged to see primary care for further workup of her migraines as it has been sometime and there are several new medications on the market that might be helpful.  Follow-up here as needed.

## 2023-08-28 NOTE — ED Triage Notes (Signed)
 States hx of migraine headaches. Reports haven't had one in awhile. States feels like her head is in a vice. Sensitive to light. + nausea. States when headaches get bad, she has to go to ER for injection. Takes pain medication daily and reports pain not being touched. Takes buprenorphine buccal film 150mcg bid and hydrocodone 5/325.
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
